# Patient Record
Sex: Male | Born: 1965 | Race: White | Hispanic: No | Marital: Married | Smoking: Current every day smoker
Health system: Southern US, Community
[De-identification: ages and names within clinical notes are randomized; demographics above are authoritative.]

## PROBLEM LIST (undated history)

## (undated) DIAGNOSIS — R918 Other nonspecific abnormal finding of lung field: Secondary | ICD-10-CM

## (undated) DIAGNOSIS — M549 Dorsalgia, unspecified: Secondary | ICD-10-CM

## (undated) DIAGNOSIS — K219 Gastro-esophageal reflux disease without esophagitis: Secondary | ICD-10-CM

## (undated) DIAGNOSIS — R079 Chest pain, unspecified: Secondary | ICD-10-CM

## (undated) DIAGNOSIS — G8929 Other chronic pain: Secondary | ICD-10-CM

## (undated) DIAGNOSIS — I10 Essential (primary) hypertension: Secondary | ICD-10-CM

## (undated) DIAGNOSIS — J449 Chronic obstructive pulmonary disease, unspecified: Secondary | ICD-10-CM

## (undated) HISTORY — PX: OTHER SURGICAL HISTORY: SHX169

---

## 2018-09-26 DIAGNOSIS — R001 Bradycardia, unspecified: Secondary | ICD-10-CM

## 2018-09-26 DIAGNOSIS — G894 Chronic pain syndrome: Secondary | ICD-10-CM | POA: Diagnosis not present

## 2018-09-26 DIAGNOSIS — I249 Acute ischemic heart disease, unspecified: Secondary | ICD-10-CM | POA: Diagnosis not present

## 2018-09-26 DIAGNOSIS — R079 Chest pain, unspecified: Secondary | ICD-10-CM | POA: Diagnosis not present

## 2018-09-26 DIAGNOSIS — I10 Essential (primary) hypertension: Secondary | ICD-10-CM

## 2018-09-26 DIAGNOSIS — Z720 Tobacco use: Secondary | ICD-10-CM

## 2018-09-27 DIAGNOSIS — R079 Chest pain, unspecified: Secondary | ICD-10-CM

## 2018-09-27 DIAGNOSIS — I340 Nonrheumatic mitral (valve) insufficiency: Secondary | ICD-10-CM | POA: Diagnosis not present

## 2018-09-27 DIAGNOSIS — I10 Essential (primary) hypertension: Secondary | ICD-10-CM | POA: Diagnosis not present

## 2018-09-27 DIAGNOSIS — I249 Acute ischemic heart disease, unspecified: Secondary | ICD-10-CM | POA: Diagnosis not present

## 2018-09-27 DIAGNOSIS — G894 Chronic pain syndrome: Secondary | ICD-10-CM | POA: Diagnosis not present

## 2018-09-27 DIAGNOSIS — R001 Bradycardia, unspecified: Secondary | ICD-10-CM | POA: Diagnosis not present

## 2019-12-10 ENCOUNTER — Emergency Department (HOSPITAL_COMMUNITY): Payer: Medicaid Other

## 2019-12-10 ENCOUNTER — Inpatient Hospital Stay (HOSPITAL_COMMUNITY)
Admission: EM | Admit: 2019-12-10 | Discharge: 2019-12-25 | DRG: 963 | Disposition: A | Discharge status: Home / Self Care | Payer: Medicaid Other | Attending: Surgery | Admitting: Surgery

## 2019-12-10 ENCOUNTER — Other Ambulatory Visit: Payer: Self-pay

## 2019-12-10 ENCOUNTER — Encounter (HOSPITAL_COMMUNITY): Payer: Self-pay

## 2019-12-10 DIAGNOSIS — T1490XA Injury, unspecified, initial encounter: Secondary | ICD-10-CM

## 2019-12-10 DIAGNOSIS — J9601 Acute respiratory failure with hypoxia: Secondary | ICD-10-CM | POA: Diagnosis present

## 2019-12-10 DIAGNOSIS — D72829 Elevated white blood cell count, unspecified: Secondary | ICD-10-CM

## 2019-12-10 DIAGNOSIS — S22089A Unspecified fracture of T11-T12 vertebra, initial encounter for closed fracture: Secondary | ICD-10-CM | POA: Diagnosis present

## 2019-12-10 DIAGNOSIS — I959 Hypotension, unspecified: Secondary | ICD-10-CM | POA: Diagnosis present

## 2019-12-10 DIAGNOSIS — Y92410 Unspecified street and highway as the place of occurrence of the external cause: Secondary | ICD-10-CM | POA: Diagnosis not present

## 2019-12-10 DIAGNOSIS — Z20822 Contact with and (suspected) exposure to covid-19: Secondary | ICD-10-CM | POA: Diagnosis present

## 2019-12-10 DIAGNOSIS — S066X9A Traumatic subarachnoid hemorrhage with loss of consciousness of unspecified duration, initial encounter: Secondary | ICD-10-CM | POA: Diagnosis present

## 2019-12-10 DIAGNOSIS — S2242XA Multiple fractures of ribs, left side, initial encounter for closed fracture: Secondary | ICD-10-CM

## 2019-12-10 DIAGNOSIS — R52 Pain, unspecified: Secondary | ICD-10-CM

## 2019-12-10 DIAGNOSIS — S42112A Displaced fracture of body of scapula, left shoulder, initial encounter for closed fracture: Secondary | ICD-10-CM | POA: Diagnosis present

## 2019-12-10 DIAGNOSIS — R402252 Coma scale, best verbal response, oriented, at arrival to emergency department: Secondary | ICD-10-CM | POA: Diagnosis present

## 2019-12-10 DIAGNOSIS — R140 Abdominal distension (gaseous): Secondary | ICD-10-CM

## 2019-12-10 DIAGNOSIS — I6200 Nontraumatic subdural hemorrhage, unspecified: Secondary | ICD-10-CM | POA: Diagnosis present

## 2019-12-10 DIAGNOSIS — M549 Dorsalgia, unspecified: Secondary | ICD-10-CM | POA: Diagnosis present

## 2019-12-10 DIAGNOSIS — G8929 Other chronic pain: Secondary | ICD-10-CM | POA: Diagnosis present

## 2019-12-10 DIAGNOSIS — K567 Ileus, unspecified: Secondary | ICD-10-CM | POA: Diagnosis not present

## 2019-12-10 DIAGNOSIS — J939 Pneumothorax, unspecified: Secondary | ICD-10-CM | POA: Diagnosis not present

## 2019-12-10 DIAGNOSIS — T07XXXA Unspecified multiple injuries, initial encounter: Secondary | ICD-10-CM | POA: Diagnosis not present

## 2019-12-10 DIAGNOSIS — R0989 Other specified symptoms and signs involving the circulatory and respiratory systems: Secondary | ICD-10-CM

## 2019-12-10 DIAGNOSIS — Z515 Encounter for palliative care: Secondary | ICD-10-CM | POA: Diagnosis not present

## 2019-12-10 DIAGNOSIS — S22079A Unspecified fracture of T9-T10 vertebra, initial encounter for closed fracture: Secondary | ICD-10-CM | POA: Diagnosis present

## 2019-12-10 DIAGNOSIS — S80211A Abrasion, right knee, initial encounter: Secondary | ICD-10-CM | POA: Diagnosis present

## 2019-12-10 DIAGNOSIS — S22039A Unspecified fracture of third thoracic vertebra, initial encounter for closed fracture: Secondary | ICD-10-CM | POA: Diagnosis present

## 2019-12-10 DIAGNOSIS — S27321A Contusion of lung, unilateral, initial encounter: Secondary | ICD-10-CM | POA: Diagnosis present

## 2019-12-10 DIAGNOSIS — Z23 Encounter for immunization: Secondary | ICD-10-CM

## 2019-12-10 DIAGNOSIS — S065X9A Traumatic subdural hemorrhage with loss of consciousness of unspecified duration, initial encounter: Secondary | ICD-10-CM | POA: Diagnosis present

## 2019-12-10 DIAGNOSIS — Z4659 Encounter for fitting and adjustment of other gastrointestinal appliance and device: Secondary | ICD-10-CM

## 2019-12-10 DIAGNOSIS — K3189 Other diseases of stomach and duodenum: Secondary | ICD-10-CM | POA: Diagnosis present

## 2019-12-10 DIAGNOSIS — R402132 Coma scale, eyes open, to sound, at arrival to emergency department: Secondary | ICD-10-CM | POA: Diagnosis present

## 2019-12-10 DIAGNOSIS — Z7189 Other specified counseling: Secondary | ICD-10-CM | POA: Diagnosis not present

## 2019-12-10 DIAGNOSIS — E876 Hypokalemia: Secondary | ICD-10-CM | POA: Diagnosis not present

## 2019-12-10 DIAGNOSIS — I609 Nontraumatic subarachnoid hemorrhage, unspecified: Secondary | ICD-10-CM | POA: Diagnosis not present

## 2019-12-10 DIAGNOSIS — S270XXA Traumatic pneumothorax, initial encounter: Secondary | ICD-10-CM | POA: Diagnosis present

## 2019-12-10 DIAGNOSIS — J439 Emphysema, unspecified: Secondary | ICD-10-CM | POA: Diagnosis present

## 2019-12-10 DIAGNOSIS — S80212A Abrasion, left knee, initial encounter: Secondary | ICD-10-CM | POA: Diagnosis present

## 2019-12-10 DIAGNOSIS — S40812A Abrasion of left upper arm, initial encounter: Secondary | ICD-10-CM | POA: Diagnosis present

## 2019-12-10 DIAGNOSIS — S2243XA Multiple fractures of ribs, bilateral, initial encounter for closed fracture: Secondary | ICD-10-CM | POA: Diagnosis present

## 2019-12-10 DIAGNOSIS — F17210 Nicotine dependence, cigarettes, uncomplicated: Secondary | ICD-10-CM | POA: Diagnosis present

## 2019-12-10 DIAGNOSIS — Z79891 Long term (current) use of opiate analgesic: Secondary | ICD-10-CM

## 2019-12-10 DIAGNOSIS — R402362 Coma scale, best motor response, obeys commands, at arrival to emergency department: Secondary | ICD-10-CM | POA: Diagnosis present

## 2019-12-10 DIAGNOSIS — S0101XA Laceration without foreign body of scalp, initial encounter: Secondary | ICD-10-CM | POA: Diagnosis present

## 2019-12-10 DIAGNOSIS — S0081XA Abrasion of other part of head, initial encounter: Secondary | ICD-10-CM | POA: Diagnosis present

## 2019-12-10 DIAGNOSIS — S60511A Abrasion of right hand, initial encounter: Secondary | ICD-10-CM | POA: Diagnosis present

## 2019-12-10 DIAGNOSIS — I10 Essential (primary) hypertension: Secondary | ICD-10-CM | POA: Diagnosis present

## 2019-12-10 DIAGNOSIS — S32049A Unspecified fracture of fourth lumbar vertebra, initial encounter for closed fracture: Secondary | ICD-10-CM | POA: Diagnosis present

## 2019-12-10 DIAGNOSIS — S42002A Fracture of unspecified part of left clavicle, initial encounter for closed fracture: Secondary | ICD-10-CM | POA: Diagnosis present

## 2019-12-10 DIAGNOSIS — Z8249 Family history of ischemic heart disease and other diseases of the circulatory system: Secondary | ICD-10-CM

## 2019-12-10 DIAGNOSIS — S2249XA Multiple fractures of ribs, unspecified side, initial encounter for closed fracture: Secondary | ICD-10-CM

## 2019-12-10 DIAGNOSIS — D649 Anemia, unspecified: Secondary | ICD-10-CM | POA: Diagnosis present

## 2019-12-10 DIAGNOSIS — J9690 Respiratory failure, unspecified, unspecified whether with hypoxia or hypercapnia: Secondary | ICD-10-CM

## 2019-12-10 HISTORY — DX: Other nonspecific abnormal finding of lung field: R91.8

## 2019-12-10 HISTORY — DX: Other chronic pain: G89.29

## 2019-12-10 HISTORY — DX: Dorsalgia, unspecified: M54.9

## 2019-12-10 HISTORY — DX: Chronic obstructive pulmonary disease, unspecified: J44.9

## 2019-12-10 HISTORY — DX: Essential (primary) hypertension: I10

## 2019-12-10 HISTORY — DX: Gastro-esophageal reflux disease without esophagitis: K21.9

## 2019-12-10 HISTORY — DX: Gastro-esophageal reflux disease without esophagitis: R07.9

## 2019-12-10 LAB — MRSA PCR SCREENING: MRSA by PCR: NEGATIVE

## 2019-12-10 LAB — URINALYSIS, ROUTINE W REFLEX MICROSCOPIC
Bacteria, UA: NONE SEEN
Bilirubin Urine: NEGATIVE
Glucose, UA: NEGATIVE mg/dL
Ketones, ur: NEGATIVE mg/dL
Leukocytes,Ua: NEGATIVE
Nitrite: NEGATIVE
Protein, ur: 30 mg/dL — AB
Specific Gravity, Urine: 1.027 (ref 1.005–1.030)
pH: 7 (ref 5.0–8.0)

## 2019-12-10 LAB — CBC
HCT: 38.3 % — ABNORMAL LOW (ref 39.0–52.0)
Hemoglobin: 12.7 g/dL — ABNORMAL LOW (ref 13.0–17.0)
MCH: 31.1 pg (ref 26.0–34.0)
MCHC: 33.2 g/dL (ref 30.0–36.0)
MCV: 93.9 fL (ref 80.0–100.0)
Platelets: 304 10*3/uL (ref 150–400)
RBC: 4.08 MIL/uL — ABNORMAL LOW (ref 4.22–5.81)
RDW: 12.7 % (ref 11.5–15.5)
WBC: 35 10*3/uL — ABNORMAL HIGH (ref 4.0–10.5)
nRBC: 0 % (ref 0.0–0.2)

## 2019-12-10 LAB — PROTIME-INR
INR: 1.1 (ref 0.8–1.2)
Prothrombin Time: 13.7 seconds (ref 11.4–15.2)

## 2019-12-10 LAB — I-STAT CHEM 8, ED
BUN: 6 mg/dL (ref 6–20)
Calcium, Ion: 1.08 mmol/L — ABNORMAL LOW (ref 1.15–1.40)
Chloride: 96 mmol/L — ABNORMAL LOW (ref 98–111)
Creatinine, Ser: 1.1 mg/dL (ref 0.61–1.24)
Glucose, Bld: 173 mg/dL — ABNORMAL HIGH (ref 70–99)
HCT: 36 % — ABNORMAL LOW (ref 39.0–52.0)
Hemoglobin: 12.2 g/dL — ABNORMAL LOW (ref 13.0–17.0)
Potassium: 2.6 mmol/L — CL (ref 3.5–5.1)
Sodium: 135 mmol/L (ref 135–145)
TCO2: 24 mmol/L (ref 22–32)

## 2019-12-10 LAB — COMPREHENSIVE METABOLIC PANEL
ALT: 82 U/L — ABNORMAL HIGH (ref 0–44)
AST: 103 U/L — ABNORMAL HIGH (ref 15–41)
Albumin: 3.2 g/dL — ABNORMAL LOW (ref 3.5–5.0)
Alkaline Phosphatase: 159 U/L — ABNORMAL HIGH (ref 38–126)
Anion gap: 13 (ref 5–15)
BUN: 5 mg/dL — ABNORMAL LOW (ref 6–20)
CO2: 23 mmol/L (ref 22–32)
Calcium: 8.5 mg/dL — ABNORMAL LOW (ref 8.9–10.3)
Chloride: 99 mmol/L (ref 98–111)
Creatinine, Ser: 1.14 mg/dL (ref 0.61–1.24)
GFR calc Af Amer: >60 mL/min (ref >60)
GFR calc non Af Amer: >60 mL/min (ref >60)
Glucose, Bld: 182 mg/dL — ABNORMAL HIGH (ref 70–99)
Potassium: 2.5 mmol/L — CL (ref 3.5–5.1)
Sodium: 135 mmol/L (ref 135–145)
Total Bilirubin: 0.9 mg/dL (ref 0.3–1.2)
Total Protein: 6.5 g/dL (ref 6.5–8.1)

## 2019-12-10 LAB — ABO/RH: ABO/RH(D): O POS

## 2019-12-10 LAB — SARS CORONAVIRUS 2 BY RT PCR (HOSPITAL ORDER, PERFORMED IN ~~LOC~~ HOSPITAL LAB): SARS Coronavirus 2: NEGATIVE

## 2019-12-10 LAB — PREPARE RBC (CROSSMATCH)

## 2019-12-10 LAB — LACTIC ACID, PLASMA: Lactic Acid, Venous: 4.2 mmol/L (ref 0.5–1.9)

## 2019-12-10 LAB — ETHANOL: Alcohol, Ethyl (B): <10 mg/dL (ref <10)

## 2019-12-10 MED ORDER — ONDANSETRON HCL 4 MG/2ML IJ SOLN
4.0000 mg | Freq: Four times a day (QID) | INTRAMUSCULAR | Status: DC | PRN
  Administered 2019-12-10 – 2019-12-21 (×4): 4 mg via INTRAVENOUS
  Filled 2019-12-10 (×4): qty 2

## 2019-12-10 MED ORDER — BACITRACIN ZINC 500 UNIT/GM EX OINT
1.0000 "application " | TOPICAL_OINTMENT | Freq: Two times a day (BID) | CUTANEOUS | Status: DC
  Administered 2019-12-10 – 2019-12-25 (×27): 1 via TOPICAL
  Filled 2019-12-10 (×3): qty 28.4
  Filled 2019-12-10 (×3): qty 28.35

## 2019-12-10 MED ORDER — HYDROMORPHONE HCL 1 MG/ML IJ SOLN
1.0000 mg | INTRAMUSCULAR | Status: DC | PRN
  Administered 2019-12-10 – 2019-12-11 (×4): 1 mg via INTRAVENOUS
  Filled 2019-12-10 (×4): qty 1

## 2019-12-10 MED ORDER — TETANUS-DIPHTH-ACELL PERTUSSIS 5-2.5-18.5 LF-MCG/0.5 IM SUSP
0.5000 mL | Freq: Once | INTRAMUSCULAR | Status: AC
  Administered 2019-12-10: 0.5 mL via INTRAMUSCULAR

## 2019-12-10 MED ORDER — ALBUMIN HUMAN 5 % IV SOLN
25.0000 g | Freq: Once | INTRAVENOUS | Status: AC
  Administered 2019-12-10: 25 g via INTRAVENOUS
  Filled 2019-12-10: qty 500

## 2019-12-10 MED ORDER — IOHEXOL 300 MG/ML  SOLN
100.0000 mL | Freq: Once | INTRAMUSCULAR | Status: AC | PRN
  Administered 2019-12-10: 100 mL via INTRAVENOUS

## 2019-12-10 MED ORDER — POTASSIUM CHLORIDE 10 MEQ/100ML IV SOLN
10.0000 meq | Freq: Once | INTRAVENOUS | Status: AC
  Administered 2019-12-10: 10 meq via INTRAVENOUS

## 2019-12-10 MED ORDER — PANTOPRAZOLE SODIUM 40 MG IV SOLR
40.0000 mg | Freq: Every day | INTRAVENOUS | Status: DC
  Administered 2019-12-10: 40 mg via INTRAVENOUS
  Filled 2019-12-10: qty 40

## 2019-12-10 MED ORDER — CEFAZOLIN SODIUM-DEXTROSE 2-4 GM/100ML-% IV SOLN
2.0000 g | Freq: Once | INTRAVENOUS | Status: AC
  Administered 2019-12-10: 2 g via INTRAVENOUS

## 2019-12-10 MED ORDER — POTASSIUM CHLORIDE IN NACL 20-0.9 MEQ/L-% IV SOLN
INTRAVENOUS | Status: DC
  Filled 2019-12-10 (×3): qty 1000

## 2019-12-10 MED ORDER — ACETAMINOPHEN 325 MG PO TABS
650.0000 mg | ORAL_TABLET | Freq: Four times a day (QID) | ORAL | Status: DC
  Administered 2019-12-10: 650 mg via ORAL
  Filled 2019-12-10 (×2): qty 2

## 2019-12-10 MED ORDER — ORAL CARE MOUTH RINSE
15.0000 mL | Freq: Two times a day (BID) | OROMUCOSAL | Status: DC
  Administered 2019-12-11 – 2019-12-15 (×9): 15 mL via OROMUCOSAL

## 2019-12-10 MED ORDER — ONDANSETRON 4 MG PO TBDP
4.0000 mg | ORAL_TABLET | Freq: Four times a day (QID) | ORAL | Status: DC | PRN
  Filled 2019-12-10: qty 1

## 2019-12-10 MED ORDER — FENTANYL CITRATE (PF) 100 MCG/2ML IJ SOLN
50.0000 ug | Freq: Once | INTRAMUSCULAR | Status: AC
  Administered 2019-12-10: 100 ug via INTRAVENOUS
  Filled 2019-12-10: qty 2

## 2019-12-10 MED ORDER — SODIUM CHLORIDE 0.9 % IV SOLN: 10.0000 mL/h | Freq: Once | INTRAVENOUS | Status: DC

## 2019-12-10 MED ORDER — METHOCARBAMOL 1000 MG/10ML IJ SOLN
1000.0000 mg | Freq: Three times a day (TID) | INTRAVENOUS | Status: DC | PRN
  Administered 2019-12-10: 1000 mg via INTRAVENOUS
  Filled 2019-12-10 (×2): qty 10

## 2019-12-10 MED ORDER — HYDROMORPHONE HCL 1 MG/ML IJ SOLN: 0.5000 mg | INTRAMUSCULAR | Status: DC | PRN

## 2019-12-10 MED ORDER — FENTANYL CITRATE (PF) 100 MCG/2ML IJ SOLN
50.0000 ug | INTRAMUSCULAR | Status: DC | PRN
  Administered 2019-12-10 – 2019-12-11 (×7): 100 ug via INTRAVENOUS
  Filled 2019-12-10 (×7): qty 2

## 2019-12-10 MED ORDER — LACTATED RINGERS IV BOLUS
1000.0000 mL | Freq: Once | INTRAVENOUS | Status: AC
  Administered 2019-12-10: 1000 mL via INTRAVENOUS

## 2019-12-10 MED ORDER — PANTOPRAZOLE SODIUM 40 MG PO TBEC: 40.0000 mg | DELAYED_RELEASE_TABLET | Freq: Every day | ORAL | Status: DC

## 2019-12-10 MED ORDER — POTASSIUM CHLORIDE 10 MEQ/100ML IV SOLN
10.0000 meq | INTRAVENOUS | Status: AC
  Administered 2019-12-10 (×2): 10 meq via INTRAVENOUS
  Filled 2019-12-10 (×3): qty 100
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
## 2019-12-10 NOTE — Procedures (Signed)
Chest Tube Insertion Procedure Note  Indications:  Clinically significant L pneumothorax  Pre-operative Diagnosis: L pneumothorax  Post-operative Diagnosis:  L pneumothorax  Procedure Details  Emergency consent due to TBI and acuity  After sterile skin prep, using standard technique, a 14 French tube was placed in the left anterior axillary line above the nipple level using Seldinger technique. It was sutured in and hooked up to a Pleurevac. Sterile dressing applied.  Findings: Air  Estimated Blood Loss: minimal         Specimens: none              Complications:  None; patient tolerated the procedure well.         Disposition: admit to ICU         Condition: guarded  Violeta Gelinas, MD, MPH, FACS Please use [email or web address] to contact on call provider
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
## 2019-12-10 NOTE — ED Notes (Signed)
Dr Janee Morn at bedside to insert left sided chest tube
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
## 2019-12-10 NOTE — ED Notes (Signed)
Pt transported to CT with this RN, [place], RN and [place], [place]

## 2019-12-10 NOTE — ED Notes (Signed)
Unit of emergency release blood completed

## 2019-12-10 NOTE — ED Notes (Signed)
X-ray at bedside

## 2019-12-10 NOTE — Progress Notes (Signed)
Chaplain checked with ED bridge. Chaplain not needed at this time.  However, chaplain received that patient was upgraded to a Level 1.  Chaplain available by page. Rev. Lynnell Chad Pager [phone]

## 2019-12-10 NOTE — ED Notes (Signed)
Potassium 2.6 on I-stat chem-8.  Dr. Silverio Lay notified

## 2019-12-10 NOTE — ED Notes (Signed)
Report called to 2H 

## 2019-12-11 ENCOUNTER — Encounter (HOSPITAL_COMMUNITY): Payer: Self-pay | Admitting: General Surgery

## 2019-12-11 ENCOUNTER — Inpatient Hospital Stay (HOSPITAL_COMMUNITY): Payer: Medicaid Other

## 2019-12-11 ENCOUNTER — Other Ambulatory Visit: Payer: Self-pay

## 2019-12-11 LAB — BASIC METABOLIC PANEL
Anion gap: 14 (ref 5–15)
BUN: 11 mg/dL (ref 6–20)
CO2: 23 mmol/L (ref 22–32)
Calcium: 8.6 mg/dL — ABNORMAL LOW (ref 8.9–10.3)
Chloride: 99 mmol/L (ref 98–111)
Creatinine, Ser: 1.11 mg/dL (ref 0.61–1.24)
GFR calc Af Amer: >60 mL/min (ref >60)
GFR calc non Af Amer: >60 mL/min (ref >60)
Glucose, Bld: 149 mg/dL — ABNORMAL HIGH (ref 70–99)
Potassium: 4.1 mmol/L (ref 3.5–5.1)
Sodium: 136 mmol/L (ref 135–145)

## 2019-12-11 LAB — TYPE AND SCREEN
ABO/RH(D): O POS
ABO/RH(D): O POS
Antibody Screen: NEGATIVE
Unit division: 0
Unit division: 0
Unit division: 0
Unit division: 0

## 2019-12-11 LAB — BPAM RBC
Blood Product Expiration Date: 202107092359
Blood Product Expiration Date: 202107092359
Blood Product Expiration Date: 202107092359
Blood Product Expiration Date: 202107102359
ISSUE DATE / TIME: 202106061307
ISSUE DATE / TIME: 202106061703
ISSUE DATE / TIME: 202106061703
Unit Type and Rh: 5100
Unit Type and Rh: 5100
Unit Type and Rh: 5100
Unit Type and Rh: 5100

## 2019-12-11 LAB — CBC
HCT: 39.1 % (ref 39.0–52.0)
Hemoglobin: 13.1 g/dL (ref 13.0–17.0)
MCH: 31 pg (ref 26.0–34.0)
MCHC: 33.5 g/dL (ref 30.0–36.0)
MCV: 92.4 fL (ref 80.0–100.0)
Platelets: 194 10*3/uL (ref 150–400)
RBC: 4.23 MIL/uL (ref 4.22–5.81)
RDW: 13 % (ref 11.5–15.5)
WBC: 15.5 10*3/uL — ABNORMAL HIGH (ref 4.0–10.5)
nRBC: 0 % (ref 0.0–0.2)

## 2019-12-11 LAB — HIV ANTIBODY (ROUTINE TESTING W REFLEX): HIV Screen 4th Generation wRfx: NONREACTIVE

## 2019-12-11 MED ORDER — PNEUMOCOCCAL VAC POLYVALENT 25 MCG/0.5ML IJ INJ: 0.5000 mL | INJECTION | INTRAMUSCULAR | Status: DC | PRN

## 2019-12-11 MED ORDER — METHOCARBAMOL 1000 MG/10ML IJ SOLN
1000.0000 mg | Freq: Three times a day (TID) | INTRAVENOUS | Status: DC
  Administered 2019-12-11 – 2019-12-16 (×16): 1000 mg via INTRAVENOUS
  Filled 2019-12-11 (×19): qty 10

## 2019-12-11 MED ORDER — PANTOPRAZOLE SODIUM 40 MG PO TBEC
40.0000 mg | DELAYED_RELEASE_TABLET | Freq: Every day | ORAL | Status: DC
  Administered 2019-12-11: 40 mg via ORAL
  Filled 2019-12-11 (×2): qty 1

## 2019-12-11 MED ORDER — ROSUVASTATIN CALCIUM 20 MG PO TABS
40.0000 mg | ORAL_TABLET | Freq: Every day | ORAL | Status: DC
  Administered 2019-12-11: 40 mg via ORAL
  Filled 2019-12-11 (×2): qty 2

## 2019-12-11 MED ORDER — OXYCODONE HCL 5 MG/5ML PO SOLN
10.0000 mg | ORAL | Status: DC | PRN
  Administered 2019-12-11: 15 mg via ORAL
  Administered 2019-12-12 (×2): 10 mg via ORAL
  Administered 2019-12-12 – 2019-12-13 (×3): 15 mg via ORAL
  Administered 2019-12-13: 10 mg via ORAL
  Administered 2019-12-14 – 2019-12-16 (×10): 15 mg via ORAL
  Filled 2019-12-11 (×4): qty 15
  Filled 2019-12-11 (×2): qty 10
  Filled 2019-12-11 (×2): qty 15
  Filled 2019-12-11: qty 10
  Filled 2019-12-11 (×8): qty 15

## 2019-12-11 MED ORDER — NALOXEGOL OXALATE 25 MG PO TABS
25.0000 mg | ORAL_TABLET | Freq: Every day | ORAL | Status: DC
  Administered 2019-12-11: 25 mg via ORAL
  Filled 2019-12-11 (×2): qty 1

## 2019-12-11 MED ORDER — SODIUM CHLORIDE 0.9 % IV SOLN: INTRAVENOUS | Status: DC

## 2019-12-11 MED ORDER — HYDRALAZINE HCL 20 MG/ML IJ SOLN
10.0000 mg | Freq: Four times a day (QID) | INTRAMUSCULAR | Status: DC | PRN
  Administered 2019-12-11: 10 mg via INTRAVENOUS
  Filled 2019-12-11: qty 1

## 2019-12-11 MED ORDER — ALBUTEROL SULFATE (2.5 MG/3ML) 0.083% IN NEBU
2.5000 mg | INHALATION_SOLUTION | RESPIRATORY_TRACT | Status: DC | PRN
  Administered 2019-12-11 – 2019-12-21 (×2): 2.5 mg via RESPIRATORY_TRACT
  Filled 2019-12-11 (×2): qty 3

## 2019-12-11 MED ORDER — HYDROCHLOROTHIAZIDE 12.5 MG PO CAPS
12.5000 mg | ORAL_CAPSULE | Freq: Every day | ORAL | Status: DC
  Administered 2019-12-12 – 2019-12-13 (×2): 12.5 mg via ORAL
  Filled 2019-12-11 (×3): qty 1

## 2019-12-11 MED ORDER — MOMETASONE FURO-FORMOTEROL FUM 200-5 MCG/ACT IN AERO
2.0000 | INHALATION_SPRAY | Freq: Two times a day (BID) | RESPIRATORY_TRACT | Status: DC
  Administered 2019-12-11 – 2019-12-25 (×22): 2 via RESPIRATORY_TRACT
  Filled 2019-12-11 (×2): qty 8.8

## 2019-12-11 MED ORDER — DULOXETINE HCL 60 MG PO CPEP
60.0000 mg | ORAL_CAPSULE | Freq: Every day | ORAL | Status: DC
  Administered 2019-12-11: 60 mg via ORAL
  Filled 2019-12-11 (×2): qty 1

## 2019-12-11 MED ORDER — SUCRALFATE 1 G PO TABS
1.0000 g | ORAL_TABLET | Freq: Four times a day (QID) | ORAL | Status: DC
  Administered 2019-12-11 (×3): 1 g via ORAL
  Filled 2019-12-11 (×6): qty 1

## 2019-12-11 MED ORDER — LEVETIRACETAM IN NACL 500 MG/100ML IV SOLN
500.0000 mg | Freq: Two times a day (BID) | INTRAVENOUS | Status: AC
  Administered 2019-12-11 – 2019-12-17 (×14): 500 mg via INTRAVENOUS
  Filled 2019-12-11 (×15): qty 100

## 2019-12-11 MED ORDER — LISINOPRIL-HYDROCHLOROTHIAZIDE 20-12.5 MG PO TABS: 1.0000 | ORAL_TABLET | Freq: Every day | ORAL | Status: DC

## 2019-12-11 MED ORDER — ACETAMINOPHEN 500 MG PO TABS
1000.0000 mg | ORAL_TABLET | Freq: Four times a day (QID) | ORAL | Status: DC
  Administered 2019-12-11 – 2019-12-12 (×3): 1000 mg via ORAL
  Filled 2019-12-11 (×4): qty 2

## 2019-12-11 MED ORDER — HYDROMORPHONE HCL 1 MG/ML IJ SOLN
1.0000 mg | INTRAMUSCULAR | Status: DC | PRN
  Administered 2019-12-11 – 2019-12-17 (×19): 1 mg via INTRAVENOUS
  Filled 2019-12-11 (×19): qty 1

## 2019-12-11 MED ORDER — LISINOPRIL 20 MG PO TABS
20.0000 mg | ORAL_TABLET | Freq: Every day | ORAL | Status: DC
  Administered 2019-12-11 – 2019-12-12 (×2): 20 mg via ORAL
  Filled 2019-12-11 (×3): qty 1

## 2019-12-11 MED ORDER — AMLODIPINE BESYLATE 10 MG PO TABS: 10.0000 mg | ORAL_TABLET | Freq: Every day | ORAL | Status: DC

## 2019-12-11 MED ORDER — DOCUSATE SODIUM 100 MG PO CAPS
100.0000 mg | ORAL_CAPSULE | Freq: Two times a day (BID) | ORAL | Status: DC
  Administered 2019-12-11 (×2): 100 mg via ORAL
  Filled 2019-12-11 (×3): qty 1

## 2019-12-11 MED ORDER — OXYCODONE HCL ER 10 MG PO T12A
30.0000 mg | EXTENDED_RELEASE_TABLET | Freq: Two times a day (BID) | ORAL | Status: DC
  Administered 2019-12-11 (×2): 30 mg via ORAL
  Filled 2019-12-11 (×3): qty 3

## 2019-12-11 MED ORDER — ENOXAPARIN SODIUM 40 MG/0.4ML ~~LOC~~ SOLN
40.0000 mg | Freq: Two times a day (BID) | SUBCUTANEOUS | Status: DC
  Administered 2019-12-12 – 2019-12-25 (×27): 40 mg via SUBCUTANEOUS
  Filled 2019-12-11 (×27): qty 0.4

## 2019-12-11 MED ORDER — CHLORHEXIDINE GLUCONATE CLOTH 2 % EX PADS
6.0000 | MEDICATED_PAD | Freq: Every day | CUTANEOUS | Status: DC
  Administered 2019-12-11 – 2019-12-21 (×10): 6 via TOPICAL

## 2019-12-11 MED ORDER — GABAPENTIN 600 MG PO TABS
600.0000 mg | ORAL_TABLET | Freq: Three times a day (TID) | ORAL | Status: DC
  Administered 2019-12-11 (×3): 600 mg via ORAL
  Filled 2019-12-11 (×4): qty 1

## 2019-12-11 MED ORDER — METOPROLOL TARTRATE 5 MG/5ML IV SOLN: 5.0000 mg | Freq: Four times a day (QID) | INTRAVENOUS | Status: DC | PRN
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
## 2019-12-11 NOTE — Progress Notes (Signed)
Rehab Admissions Coordinator Note:  Patient was screened by Clois Dupes for appropriateness for an Inpatient Acute Rehab Consult per therapy recs but patient has not participated yet. I will follow progress before seeking consult.    Clois Dupes RN MSN 12/11/2019, 5:58 PM  I can be reached at [phone].
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
## 2019-12-11 NOTE — Progress Notes (Addendum)
Pt had an episode of emesis at 0400. Zofran was given. Pt refused to get his sheets changed from this episode due to the amount of pain he was in. Pt was educated and pain medication was given.  The pt stated that he needed to cough when offering his scheduled medication at midnight. Due to that reason, he refused the medication. The pt was educated on coughing and deep breathing, but refused to attempt.  The pt has refused to be turned throughout the night due to his pain level, education was provided.   The pt voided in the bed at 0600. He refused to get his sheets changed due to pain. Education was provided.

## 2019-12-11 NOTE — Progress Notes (Signed)
Pt having episodes of desating due to not being able to take a deep breath.  Patient was on 5 lpm nasal cannula.  Patient now on 7 liter high flow salter cannula.  Sp02 is improving.
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
## 2019-12-12 ENCOUNTER — Inpatient Hospital Stay (HOSPITAL_COMMUNITY): Payer: Medicaid Other

## 2019-12-12 LAB — POCT I-STAT 7, (LYTES, BLD GAS, ICA,H+H)
Acid-Base Excess: 1 mmol/L (ref 0.0–2.0)
Bicarbonate: 26.3 mmol/L (ref 20.0–28.0)
Calcium, Ion: 1.15 mmol/L (ref 1.15–1.40)
HCT: 29 % — ABNORMAL LOW (ref 39.0–52.0)
Hemoglobin: 9.9 g/dL — ABNORMAL LOW (ref 13.0–17.0)
O2 Saturation: 96 %
Patient temperature: 99.2
Potassium: 3.6 mmol/L (ref 3.5–5.1)
Sodium: 136 mmol/L (ref 135–145)
TCO2: 28 mmol/L (ref 22–32)
pCO2 arterial: 43.7 mmHg (ref 32.0–48.0)
pH, Arterial: 7.388 (ref 7.350–7.450)
pO2, Arterial: 86 mmHg (ref 83.0–108.0)

## 2019-12-12 LAB — BASIC METABOLIC PANEL
Anion gap: 10 (ref 5–15)
BUN: 15 mg/dL (ref 6–20)
CO2: 24 mmol/L (ref 22–32)
Calcium: 7.9 mg/dL — ABNORMAL LOW (ref 8.9–10.3)
Chloride: 101 mmol/L (ref 98–111)
Creatinine, Ser: 0.78 mg/dL (ref 0.61–1.24)
GFR calc Af Amer: >60 mL/min (ref >60)
GFR calc non Af Amer: >60 mL/min (ref >60)
Glucose, Bld: 134 mg/dL — ABNORMAL HIGH (ref 70–99)
Potassium: 3.6 mmol/L (ref 3.5–5.1)
Sodium: 135 mmol/L (ref 135–145)

## 2019-12-12 LAB — CBC
HCT: 31.9 % — ABNORMAL LOW (ref 39.0–52.0)
Hemoglobin: 10.9 g/dL — ABNORMAL LOW (ref 13.0–17.0)
MCH: 31.3 pg (ref 26.0–34.0)
MCHC: 34.2 g/dL (ref 30.0–36.0)
MCV: 91.7 fL (ref 80.0–100.0)
Platelets: 179 10*3/uL (ref 150–400)
RBC: 3.48 MIL/uL — ABNORMAL LOW (ref 4.22–5.81)
RDW: 12.9 % (ref 11.5–15.5)
WBC: 16.6 10*3/uL — ABNORMAL HIGH (ref 4.0–10.5)
nRBC: 0 % (ref 0.0–0.2)

## 2019-12-12 LAB — MAGNESIUM: Magnesium: 1.8 mg/dL (ref 1.7–2.4)

## 2019-12-12 LAB — PHOSPHORUS: Phosphorus: 2.5 mg/dL (ref 2.5–4.6)

## 2019-12-12 MED ORDER — GABAPENTIN 250 MG/5ML PO SOLN
600.0000 mg | Freq: Three times a day (TID) | ORAL | Status: DC
  Administered 2019-12-12 – 2019-12-15 (×10): 600 mg
  Filled 2019-12-12 (×12): qty 12

## 2019-12-12 MED ORDER — SUCRALFATE 1 GM/10ML PO SUSP
1.0000 g | Freq: Three times a day (TID) | ORAL | Status: DC
  Administered 2019-12-12 – 2019-12-15 (×13): 1 g
  Filled 2019-12-12 (×14): qty 10

## 2019-12-12 MED ORDER — ROSUVASTATIN CALCIUM 20 MG PO TABS
40.0000 mg | ORAL_TABLET | Freq: Every day | ORAL | Status: DC
  Administered 2019-12-12 – 2019-12-15 (×4): 40 mg
  Filled 2019-12-12 (×5): qty 2

## 2019-12-12 MED ORDER — HYDROMORPHONE HCL 1 MG/ML IJ SOLN
1.0000 mg | Freq: Once | INTRAMUSCULAR | Status: AC
  Administered 2019-12-12: 1 mg via INTRAVENOUS
  Filled 2019-12-12: qty 1

## 2019-12-12 MED ORDER — ALBUTEROL SULFATE (2.5 MG/3ML) 0.083% IN NEBU
2.5000 mg | INHALATION_SOLUTION | RESPIRATORY_TRACT | Status: AC
  Administered 2019-12-12 – 2019-12-14 (×11): 2.5 mg via RESPIRATORY_TRACT
  Filled 2019-12-12 (×12): qty 3

## 2019-12-12 MED ORDER — ACETAMINOPHEN 160 MG/5ML PO SOLN
650.0000 mg | Freq: Four times a day (QID) | ORAL | Status: DC
  Administered 2019-12-12 – 2019-12-15 (×13): 650 mg
  Filled 2019-12-12 (×13): qty 20.3

## 2019-12-12 MED ORDER — POTASSIUM PHOSPHATES 15 MMOLE/5ML IV SOLN
30.0000 mmol | Freq: Once | INTRAVENOUS | Status: AC
  Administered 2019-12-12: 30 mmol via INTRAVENOUS
  Filled 2019-12-12: qty 10

## 2019-12-12 MED ORDER — NALOXEGOL OXALATE 25 MG PO TABS
25.0000 mg | ORAL_TABLET | Freq: Every day | ORAL | Status: DC
  Administered 2019-12-12 – 2019-12-15 (×4): 25 mg via NASOGASTRIC
  Filled 2019-12-12 (×5): qty 1

## 2019-12-12 MED ORDER — PANTOPRAZOLE SODIUM 40 MG PO PACK
40.0000 mg | PACK | Freq: Every day | ORAL | Status: DC
  Administered 2019-12-12 – 2019-12-15 (×4): 40 mg
  Filled 2019-12-12 (×4): qty 20

## 2019-12-12 MED ORDER — MAGNESIUM SULFATE 2 GM/50ML IV SOLN
2.0000 g | Freq: Once | INTRAVENOUS | Status: AC
  Administered 2019-12-12: 2 g via INTRAVENOUS
  Filled 2019-12-12: qty 50

## 2019-12-12 MED ORDER — DOCUSATE SODIUM 50 MG/5ML PO LIQD
100.0000 mg | Freq: Two times a day (BID) | ORAL | Status: DC
  Administered 2019-12-12 – 2019-12-15 (×7): 100 mg
  Filled 2019-12-12 (×7): qty 10

## 2019-12-12 NOTE — TOC CAGE-AID Note (Signed)
Transition of Care [facility]) - CAGE-AID Screening   Patient Details  Name: Eric Blake MRN: [id number] Date of Birth: [birth year]  [facility] CM/SW Contact:    Jimmy Picket, LCSWA Phone Number: 12/12/2019, 1:49 PM   Clinical Narrative:  Pt was unable to participate in assessment due to not being oriented.   CAGE-AID Screening: Substance Abuse Screening unable to be completed due to: : Patient unable to participate               Isabella Stalling Clinical Social Worker [phone]

## 2019-12-12 NOTE — Progress Notes (Signed)
SLP Cancellation Note  Patient Details Name: Eric Blake MRN: [id number] DOB: Jan 04, 1966   Cancelled treatment:       Reason Eval/Treat Not Completed: Fatigue/lethargy limiting ability to participate. Patient given pain meds this am for placement of NG tube. Per RN, was following commands and verbalizing this am. Now not verbalizing, did shake head no when asked if he was able to wake up for SLP evaluation. Patient is too lethargic for evaluation this am, contribution of meds would not allow for clear picture of cognitive abilities. Will f/u 6/9.  Makaylynn Bonillas MA, CCC-SLP     Etoy Mcdonnell Meryl 12/12/2019, 10:06 AM
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
## 2019-12-12 NOTE — Progress Notes (Signed)
Orthopedic Tech Progress Note Patient Details:  Eric Blake 11/16/1965 [id number] Spoke with THERAPY about patient needing ARM SLING. RN said after she does patient wound changing she would apply sling. Ortho Devices Type of Ortho Device: Arm sling Ortho Device/Splint Location: LUE Ortho Device/Splint Interventions: Adjustment, Other (comment)   Post Interventions Patient Tolerated: Well Instructions Provided: Care of device   [name] 12/12/2019, 3:22 PM

## 2019-12-12 NOTE — Progress Notes (Signed)
Patient ID: Eric Blake, male   DOB: 1966/02/28, 54 y.o.   MRN: [id number] I came by to re-evaluate resp status. [place] O2 has been weaned down to 10L but sats 87% with a lot of rhonchi. He would not cough so I did NTS. Some secretions cleared and sats up to 91%. Continue NTS PRN and we will watch closely. Decrease IVF to 50/h. I advised him that he needs to cough or he will end up intubated.  Patient examined and I agree with the assessment and plan  Violeta Gelinas, MD, MPH, FACS Please use [email or web address] to contact on call provider 12/12/2019 12:32 PM
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
## 2019-12-13 ENCOUNTER — Inpatient Hospital Stay (HOSPITAL_COMMUNITY): Payer: Medicaid Other

## 2019-12-13 DIAGNOSIS — Z7189 Other specified counseling: Secondary | ICD-10-CM

## 2019-12-13 DIAGNOSIS — J939 Pneumothorax, unspecified: Secondary | ICD-10-CM

## 2019-12-13 DIAGNOSIS — Z515 Encounter for palliative care: Secondary | ICD-10-CM

## 2019-12-13 DIAGNOSIS — T07XXXA Unspecified multiple injuries, initial encounter: Secondary | ICD-10-CM

## 2019-12-13 DIAGNOSIS — J9601 Acute respiratory failure with hypoxia: Secondary | ICD-10-CM

## 2019-12-13 DIAGNOSIS — I6200 Nontraumatic subdural hemorrhage, unspecified: Secondary | ICD-10-CM

## 2019-12-13 DIAGNOSIS — T1490XA Injury, unspecified, initial encounter: Secondary | ICD-10-CM

## 2019-12-13 DIAGNOSIS — S2242XA Multiple fractures of ribs, left side, initial encounter for closed fracture: Secondary | ICD-10-CM

## 2019-12-13 DIAGNOSIS — I609 Nontraumatic subarachnoid hemorrhage, unspecified: Secondary | ICD-10-CM

## 2019-12-13 LAB — BASIC METABOLIC PANEL
Anion gap: 11 (ref 5–15)
BUN: 17 mg/dL (ref 6–20)
CO2: 25 mmol/L (ref 22–32)
Calcium: 7.5 mg/dL — ABNORMAL LOW (ref 8.9–10.3)
Chloride: 101 mmol/L (ref 98–111)
Creatinine, Ser: 0.81 mg/dL (ref 0.61–1.24)
GFR calc Af Amer: >60 mL/min (ref >60)
GFR calc non Af Amer: >60 mL/min (ref >60)
Glucose, Bld: 124 mg/dL — ABNORMAL HIGH (ref 70–99)
Potassium: 3.3 mmol/L — ABNORMAL LOW (ref 3.5–5.1)
Sodium: 137 mmol/L (ref 135–145)

## 2019-12-13 LAB — CBC
HCT: 26.7 % — ABNORMAL LOW (ref 39.0–52.0)
Hemoglobin: 8.9 g/dL — ABNORMAL LOW (ref 13.0–17.0)
MCH: 30.6 pg (ref 26.0–34.0)
MCHC: 33.3 g/dL (ref 30.0–36.0)
MCV: 91.8 fL (ref 80.0–100.0)
Platelets: 134 10*3/uL — ABNORMAL LOW (ref 150–400)
RBC: 2.91 MIL/uL — ABNORMAL LOW (ref 4.22–5.81)
RDW: 13.1 % (ref 11.5–15.5)
WBC: 8.7 10*3/uL (ref 4.0–10.5)
nRBC: 0 % (ref 0.0–0.2)

## 2019-12-13 LAB — GLUCOSE, CAPILLARY
Glucose-Capillary: 108 mg/dL — ABNORMAL HIGH (ref 70–99)
Glucose-Capillary: 125 mg/dL — ABNORMAL HIGH (ref 70–99)
Glucose-Capillary: 141 mg/dL — ABNORMAL HIGH (ref 70–99)
Glucose-Capillary: 173 mg/dL — ABNORMAL HIGH (ref 70–99)

## 2019-12-13 LAB — MAGNESIUM: Magnesium: 2.4 mg/dL (ref 1.7–2.4)

## 2019-12-13 LAB — PHOSPHORUS: Phosphorus: 2.5 mg/dL (ref 2.5–4.6)

## 2019-12-13 MED ORDER — FUROSEMIDE 10 MG/ML IJ SOLN
20.0000 mg | Freq: Once | INTRAMUSCULAR | Status: AC
  Administered 2019-12-13: 20 mg via INTRAVENOUS
  Filled 2019-12-13: qty 2

## 2019-12-13 MED ORDER — LISINOPRIL 20 MG PO TABS
20.0000 mg | ORAL_TABLET | Freq: Every day | ORAL | Status: DC
  Administered 2019-12-14 – 2019-12-15 (×2): 20 mg
  Filled 2019-12-13 (×2): qty 1

## 2019-12-13 MED ORDER — VITAL HIGH PROTEIN PO LIQD: 1000.0000 mL | ORAL | Status: DC

## 2019-12-13 MED ORDER — HYDROCHLOROTHIAZIDE 10 MG/ML ORAL SUSPENSION
12.5000 mg | Freq: Every day | ORAL | Status: DC
  Administered 2019-12-14 – 2019-12-15 (×2): 13 mg
  Filled 2019-12-13 (×3): qty 1.88

## 2019-12-13 MED ORDER — POTASSIUM PHOSPHATES 15 MMOLE/5ML IV SOLN
30.0000 mmol | Freq: Once | INTRAVENOUS | Status: AC
  Administered 2019-12-13: 30 mmol via INTRAVENOUS
  Filled 2019-12-13: qty 10

## 2019-12-13 MED ORDER — POTASSIUM CHLORIDE 10 MEQ/100ML IV SOLN
INTRAVENOUS | Status: AC
  Filled 2019-12-13: qty 200

## 2019-12-13 MED ORDER — PIVOT 1.5 CAL PO LIQD
1000.0000 mL | ORAL | Status: DC
  Filled 2019-12-13: qty 1000

## 2019-12-13 MED ORDER — POTASSIUM CHLORIDE 10 MEQ/100ML IV SOLN
10.0000 meq | INTRAVENOUS | Status: AC
  Administered 2019-12-13 (×6): 10 meq via INTRAVENOUS
  Filled 2019-12-13 (×4): qty 100

## 2019-12-13 MED ORDER — PRO-STAT SUGAR FREE PO LIQD
30.0000 mL | Freq: Two times a day (BID) | ORAL | Status: DC
  Filled 2019-12-13: qty 30

## 2019-12-13 MED ORDER — HYDROCHLOROTHIAZIDE 12.5 MG PO CAPS: 12.5000 mg | ORAL_CAPSULE | Freq: Every day | ORAL | Status: DC
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
## 2019-12-13 NOTE — Progress Notes (Signed)
RN called stating pt desat during bath.  RT arrived to find pt needing NTS with audible rattle.  RT attempted to suction pt through oral suction but pt would not allow catheter to pass his tongue before pushing it away.  RT explained he would be placed on vent if he didn't allow [place] to suction or attempt to cough up secretions.  Pt says "Let me go, just let me go.'  Rt further explained this could possibly lead to more invasive means of support but pt was adamant that he did not want [place] to suction him. Pt now on 15LPM salter with sat of 91%.  RT will continue to monitor.

## 2019-12-13 NOTE — Progress Notes (Signed)
Called to see patient for continued lack of participation in care requiring multiple rounds of deep NT suctioning due to hypoxia and increased O2 requirements. At the time of my exam, patient is refusing to cough/deep breathe and is on a NRB with sats mid-90s. Wife is at bedside and conversation held with both of them regarding his current clinical status and impending intubation. Wife is very overwhelmed at the possibility of needing intubation and was unable to make a definitive decision. She requested I speak with her mother via phone, which I did, and explained the situation. Clearly stated that without participating in coughing/deep breathing, he will continue to decline, ultimately resulting in need for mechanical ventilation or death. Patient's mother-in-law deferred decision-making back to the patient's wife. Re-visited conversation with the patient at this time and he did make a couple of weak attempts at coughing and when provided the options of further efforts at cough/deep breathe vs intubation, elected to improve efforts to cough/deep breathe. Will attempt bipap, however if continuing to desaturate will need intubation if family is agreeable.  Diamantina Monks, MD General and Trauma Surgery [facility]
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
## 2019-12-13 NOTE — Progress Notes (Addendum)
NG dislodged while transferring pt back to bed. Attempted NG replacement, pt O2 sats decreased to 80's. Placed on NRB and called Respiratory to bedside.
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
## 2019-12-13 NOTE — Progress Notes (Signed)
RT Note:  Patient NTS x2. Moderate amount of thick pale yellow secretions obtained. Deep oral suctioned. Patient complaining of pain. Declined NTS again. Rhonchi/coarse crackles noted. RN gave pain medicine. Patient 90% on 15L HFNC.
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
## 2019-12-13 NOTE — Progress Notes (Signed)
RT note-Called to place Bipap, face projection placed and suctioning done prior to placing Bipap, Patient is very comfortable on Bipap, BBS rhonchi throuthout, continue to monitor.

## 2019-12-14 ENCOUNTER — Inpatient Hospital Stay (HOSPITAL_COMMUNITY): Payer: Medicaid Other

## 2019-12-14 ENCOUNTER — Inpatient Hospital Stay: Payer: Self-pay

## 2019-12-14 LAB — GLUCOSE, CAPILLARY
Glucose-Capillary: 105 mg/dL — ABNORMAL HIGH (ref 70–99)
Glucose-Capillary: 102 mg/dL — ABNORMAL HIGH (ref 70–99)
Glucose-Capillary: 119 mg/dL — ABNORMAL HIGH (ref 70–99)
Glucose-Capillary: 130 mg/dL — ABNORMAL HIGH (ref 70–99)
Glucose-Capillary: 119 mg/dL — ABNORMAL HIGH (ref 70–99)

## 2019-12-14 LAB — POCT I-STAT 7, (LYTES, BLD GAS, ICA,H+H)
Acid-Base Excess: 5 mmol/L — ABNORMAL HIGH (ref 0.0–2.0)
Bicarbonate: 29.6 mmol/L — ABNORMAL HIGH (ref 20.0–28.0)
Calcium, Ion: 1.1 mmol/L — ABNORMAL LOW (ref 1.15–1.40)
HCT: 24 % — ABNORMAL LOW (ref 39.0–52.0)
Hemoglobin: 8.2 g/dL — ABNORMAL LOW (ref 13.0–17.0)
O2 Saturation: 97 %
Potassium: 3.2 mmol/L — ABNORMAL LOW (ref 3.5–5.1)
Sodium: 138 mmol/L (ref 135–145)
TCO2: 31 mmol/L (ref 22–32)
pCO2 arterial: 42.2 mmHg (ref 32.0–48.0)
pH, Arterial: 7.454 — ABNORMAL HIGH (ref 7.350–7.450)
pO2, Arterial: 84 mmHg (ref 83.0–108.0)

## 2019-12-14 LAB — BASIC METABOLIC PANEL
Anion gap: 9 (ref 5–15)
BUN: 20 mg/dL (ref 6–20)
CO2: 25 mmol/L (ref 22–32)
Calcium: 7.5 mg/dL — ABNORMAL LOW (ref 8.9–10.3)
Chloride: 102 mmol/L (ref 98–111)
Creatinine, Ser: 0.8 mg/dL (ref 0.61–1.24)
GFR calc Af Amer: >60 mL/min (ref >60)
GFR calc non Af Amer: >60 mL/min (ref >60)
Glucose, Bld: 120 mg/dL — ABNORMAL HIGH (ref 70–99)
Potassium: 3.3 mmol/L — ABNORMAL LOW (ref 3.5–5.1)
Sodium: 136 mmol/L (ref 135–145)

## 2019-12-14 LAB — CBC
HCT: 26.2 % — ABNORMAL LOW (ref 39.0–52.0)
Hemoglobin: 8.9 g/dL — ABNORMAL LOW (ref 13.0–17.0)
MCH: 31.6 pg (ref 26.0–34.0)
MCHC: 34 g/dL (ref 30.0–36.0)
MCV: 92.9 fL (ref 80.0–100.0)
Platelets: 139 10*3/uL — ABNORMAL LOW (ref 150–400)
RBC: 2.82 MIL/uL — ABNORMAL LOW (ref 4.22–5.81)
RDW: 13 % (ref 11.5–15.5)
WBC: 9.2 10*3/uL (ref 4.0–10.5)
nRBC: 0 % (ref 0.0–0.2)

## 2019-12-14 LAB — MAGNESIUM: Magnesium: 2.2 mg/dL (ref 1.7–2.4)

## 2019-12-14 LAB — PHOSPHORUS: Phosphorus: 2.6 mg/dL (ref 2.5–4.6)

## 2019-12-14 MED ORDER — FUROSEMIDE 10 MG/ML IJ SOLN
80.0000 mg | Freq: Once | INTRAMUSCULAR | Status: AC
  Administered 2019-12-14: 80 mg via INTRAVENOUS
  Filled 2019-12-14: qty 8

## 2019-12-14 MED ORDER — POTASSIUM CHLORIDE 20 MEQ PO PACK
40.0000 meq | PACK | Freq: Two times a day (BID) | ORAL | Status: DC
  Administered 2019-12-14: 40 meq via ORAL
  Filled 2019-12-14: qty 2

## 2019-12-14 MED ORDER — TRAMADOL HCL 50 MG PO TABS
50.0000 mg | ORAL_TABLET | Freq: Four times a day (QID) | ORAL | Status: DC
  Administered 2019-12-14 – 2019-12-15 (×5): 50 mg
  Filled 2019-12-14 (×5): qty 1

## 2019-12-14 MED ORDER — TRAMADOL 5 MG/ML ORAL SUSPENSION: 50.0000 mg | Freq: Four times a day (QID) | ORAL | Status: DC

## 2019-12-14 MED ORDER — POTASSIUM CHLORIDE 20 MEQ/15ML (10%) PO SOLN
40.0000 meq | Freq: Once | ORAL | Status: AC
  Administered 2019-12-14: 40 meq
  Filled 2019-12-14: qty 30

## 2019-12-14 NOTE — Progress Notes (Signed)
Peripherally Inserted Central Catheter Placement  The IV Nurse has discussed with the patient and/or persons authorized to consent for the patient, the purpose of this procedure and the potential benefits and risks involved with this procedure.  The benefits include less needle sticks, lab draws from the catheter, and the patient may be discharged home with the catheter. Risks include, but not limited to, infection, bleeding, blood clot (thrombus formation), and puncture of an artery; nerve damage and irregular heartbeat and possibility to perform a PICC exchange if needed/ordered by physician.  Alternatives to this procedure were also discussed.  Bard Power PICC patient education guide, fact sheet on infection prevention and patient information card has been provided to patient /or left at bedside.    PICC Placement Documentation        Eric Blake 12/14/2019, 7:17 PM

## 2019-12-14 NOTE — Progress Notes (Signed)
Patient ID: Eric Blake, male   DOB: October 28, 1965, 54 y.o.   MRN: [id number] Off BiPAP and on HFNC O2. Having pain - add scheduled Ultram. CT to H2O seal. I spoke with his wife at the bedside.  Violeta Gelinas, MD, MPH, FACS Please use [email or web address] to contact on call provider

## 2019-12-14 NOTE — Progress Notes (Signed)
RT asked to NTS pt d/t secretions. Pt did not tolerate very well even with nasal trumpet in place. RT unable to pass nasal catheter through trumpet to suction pt. Nasal trumpet removed and RT still unable to pass suction catheter through nare very far. Pt stated his nose has been broken before and was never fixed. RN and RT working with pt on coughing and expectorating secretions. Flutter ordered to help pt with secretion expectoration. RT will continue to monitor.
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
## 2019-12-14 NOTE — Progress Notes (Signed)
Attempted right upper arm basilic vein with complications, unable to thread PICC pass clavicle despite multiple techniques attempts to pass PICC. Various positions attempted with arm and bed. Used 60 cm wire per protocol without success.
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
## 2019-12-15 ENCOUNTER — Inpatient Hospital Stay (HOSPITAL_COMMUNITY): Payer: Medicaid Other

## 2019-12-15 DIAGNOSIS — Z515 Encounter for palliative care: Secondary | ICD-10-CM

## 2019-12-15 LAB — BASIC METABOLIC PANEL
Anion gap: 8 (ref 5–15)
BUN: 23 mg/dL — ABNORMAL HIGH (ref 6–20)
CO2: 26 mmol/L (ref 22–32)
Calcium: 7.9 mg/dL — ABNORMAL LOW (ref 8.9–10.3)
Chloride: 102 mmol/L (ref 98–111)
Creatinine, Ser: 0.84 mg/dL (ref 0.61–1.24)
GFR calc Af Amer: >60 mL/min (ref >60)
GFR calc non Af Amer: >60 mL/min (ref >60)
Glucose, Bld: 101 mg/dL — ABNORMAL HIGH (ref 70–99)
Potassium: 3.3 mmol/L — ABNORMAL LOW (ref 3.5–5.1)
Sodium: 136 mmol/L (ref 135–145)

## 2019-12-15 LAB — CBC
HCT: 27 % — ABNORMAL LOW (ref 39.0–52.0)
Hemoglobin: 9.1 g/dL — ABNORMAL LOW (ref 13.0–17.0)
MCH: 31.1 pg (ref 26.0–34.0)
MCHC: 33.7 g/dL (ref 30.0–36.0)
MCV: 92.2 fL (ref 80.0–100.0)
Platelets: 158 10*3/uL (ref 150–400)
RBC: 2.93 MIL/uL — ABNORMAL LOW (ref 4.22–5.81)
RDW: 13 % (ref 11.5–15.5)
WBC: 10.1 10*3/uL (ref 4.0–10.5)
nRBC: 0 % (ref 0.0–0.2)

## 2019-12-15 LAB — GLUCOSE, CAPILLARY
Glucose-Capillary: 102 mg/dL — ABNORMAL HIGH (ref 70–99)
Glucose-Capillary: 105 mg/dL — ABNORMAL HIGH (ref 70–99)
Glucose-Capillary: 105 mg/dL — ABNORMAL HIGH (ref 70–99)
Glucose-Capillary: 122 mg/dL — ABNORMAL HIGH (ref 70–99)
Glucose-Capillary: 88 mg/dL (ref 70–99)
Glucose-Capillary: 93 mg/dL (ref 70–99)

## 2019-12-15 MED ORDER — HALOPERIDOL LACTATE 5 MG/ML IJ SOLN
5.0000 mg | Freq: Four times a day (QID) | INTRAMUSCULAR | Status: DC | PRN
  Administered 2019-12-15 – 2019-12-16 (×5): 5 mg via INTRAVENOUS
  Filled 2019-12-15 (×6): qty 1

## 2019-12-15 MED ORDER — ORAL CARE MOUTH RINSE
15.0000 mL | Freq: Two times a day (BID) | OROMUCOSAL | Status: DC
  Administered 2019-12-15 – 2019-12-20 (×10): 15 mL via OROMUCOSAL

## 2019-12-15 MED ORDER — POTASSIUM CHLORIDE 20 MEQ/15ML (10%) PO SOLN
40.0000 meq | ORAL | Status: AC
  Administered 2019-12-15 (×2): 40 meq
  Filled 2019-12-15 (×2): qty 30

## 2019-12-15 MED ORDER — SODIUM CHLORIDE 0.9% FLUSH
10.0000 mL | Freq: Two times a day (BID) | INTRAVENOUS | Status: DC
  Administered 2019-12-15 – 2019-12-21 (×10): 10 mL

## 2019-12-15 MED ORDER — HYDROMORPHONE HCL 1 MG/ML IJ SOLN
2.0000 mg | Freq: Once | INTRAMUSCULAR | Status: AC
  Administered 2019-12-15: 2 mg via INTRAVENOUS
  Filled 2019-12-15: qty 2

## 2019-12-15 MED ORDER — CHLORHEXIDINE GLUCONATE 0.12 % MT SOLN
15.0000 mL | Freq: Two times a day (BID) | OROMUCOSAL | Status: DC
  Administered 2019-12-15 – 2019-12-21 (×13): 15 mL via OROMUCOSAL
  Filled 2019-12-15 (×10): qty 15

## 2019-12-15 MED ORDER — SODIUM CHLORIDE 0.9% FLUSH: 10.0000 mL | INTRAVENOUS | Status: DC | PRN

## 2019-12-15 MED ORDER — OXYCODONE HCL ER 15 MG PO T12A
30.0000 mg | EXTENDED_RELEASE_TABLET | Freq: Two times a day (BID) | ORAL | Status: DC
  Administered 2019-12-15 – 2019-12-25 (×20): 30 mg via ORAL
  Filled 2019-12-15 (×8): qty 2
  Filled 2019-12-15: qty 3
  Filled 2019-12-15 (×12): qty 2
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
## 2019-12-15 NOTE — Progress Notes (Signed)
Pts order is BIPAP PRN. Pt respiratory status is stable on 10 Lpm HFNC [place]. Pt in no distress at this time. RT will continue to monitor.

## 2019-12-15 NOTE — Progress Notes (Signed)
RT note: RT instructed patient with use of flutter valve. Patient displayed good effort with productive cough post use.
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
## 2019-12-16 ENCOUNTER — Inpatient Hospital Stay (HOSPITAL_COMMUNITY): Payer: Medicaid Other

## 2019-12-16 LAB — CBC
HCT: 27.7 % — ABNORMAL LOW (ref 39.0–52.0)
Hemoglobin: 9.3 g/dL — ABNORMAL LOW (ref 13.0–17.0)
MCH: 30.5 pg (ref 26.0–34.0)
MCHC: 33.6 g/dL (ref 30.0–36.0)
MCV: 90.8 fL (ref 80.0–100.0)
Platelets: 158 10*3/uL (ref 150–400)
RBC: 3.05 MIL/uL — ABNORMAL LOW (ref 4.22–5.81)
RDW: 12.8 % (ref 11.5–15.5)
WBC: 9.8 10*3/uL (ref 4.0–10.5)
nRBC: 0 % (ref 0.0–0.2)

## 2019-12-16 LAB — BASIC METABOLIC PANEL
Anion gap: 10 (ref 5–15)
BUN: 21 mg/dL — ABNORMAL HIGH (ref 6–20)
CO2: 24 mmol/L (ref 22–32)
Calcium: 8 mg/dL — ABNORMAL LOW (ref 8.9–10.3)
Chloride: 99 mmol/L (ref 98–111)
Creatinine, Ser: 0.82 mg/dL (ref 0.61–1.24)
GFR calc Af Amer: >60 mL/min (ref >60)
GFR calc non Af Amer: >60 mL/min (ref >60)
Glucose, Bld: 100 mg/dL — ABNORMAL HIGH (ref 70–99)
Potassium: 3.1 mmol/L — ABNORMAL LOW (ref 3.5–5.1)
Sodium: 133 mmol/L — ABNORMAL LOW (ref 135–145)

## 2019-12-16 LAB — GLUCOSE, CAPILLARY
Glucose-Capillary: 91 mg/dL (ref 70–99)
Glucose-Capillary: 90 mg/dL (ref 70–99)
Glucose-Capillary: 95 mg/dL (ref 70–99)
Glucose-Capillary: 104 mg/dL — ABNORMAL HIGH (ref 70–99)
Glucose-Capillary: 100 mg/dL — ABNORMAL HIGH (ref 70–99)
Glucose-Capillary: 87 mg/dL (ref 70–99)

## 2019-12-16 MED ORDER — OXYCODONE HCL 5 MG PO TABS
10.0000 mg | ORAL_TABLET | ORAL | Status: DC | PRN
  Administered 2019-12-18: 15 mg via ORAL
  Administered 2019-12-19: 10 mg via ORAL
  Administered 2019-12-19 (×2): 15 mg via ORAL
  Administered 2019-12-20 (×2): 10 mg via ORAL
  Administered 2019-12-21 – 2019-12-25 (×10): 15 mg via ORAL
  Filled 2019-12-16 (×5): qty 3
  Filled 2019-12-16: qty 2
  Filled 2019-12-16 (×2): qty 3
  Filled 2019-12-16: qty 2
  Filled 2019-12-16 (×6): qty 3
  Filled 2019-12-16: qty 2
  Filled 2019-12-16: qty 3

## 2019-12-16 MED ORDER — HYDROCHLOROTHIAZIDE 12.5 MG PO CAPS
12.5000 mg | ORAL_CAPSULE | Freq: Every day | ORAL | Status: DC
  Administered 2019-12-16 – 2019-12-25 (×10): 12.5 mg via ORAL
  Filled 2019-12-16 (×10): qty 1

## 2019-12-16 MED ORDER — ACETAMINOPHEN 160 MG/5ML PO SOLN: 650.0000 mg | Freq: Four times a day (QID) | ORAL | Status: DC

## 2019-12-16 MED ORDER — BISACODYL 10 MG RE SUPP: 10.0000 mg | Freq: Every day | RECTAL | Status: DC | PRN

## 2019-12-16 MED ORDER — TRAMADOL HCL 50 MG PO TABS
50.0000 mg | ORAL_TABLET | Freq: Four times a day (QID) | ORAL | Status: DC
  Administered 2019-12-16 – 2019-12-25 (×37): 50 mg via ORAL
  Filled 2019-12-16 (×37): qty 1

## 2019-12-16 MED ORDER — SUCRALFATE 1 G PO TABS
1.0000 g | ORAL_TABLET | Freq: Three times a day (TID) | ORAL | Status: DC
  Administered 2019-12-16 – 2019-12-25 (×37): 1 g via ORAL
  Filled 2019-12-16 (×38): qty 1

## 2019-12-16 MED ORDER — POTASSIUM CHLORIDE CRYS ER 20 MEQ PO TBCR
40.0000 meq | EXTENDED_RELEASE_TABLET | Freq: Two times a day (BID) | ORAL | Status: DC
  Administered 2019-12-16 – 2019-12-25 (×19): 40 meq via ORAL
  Filled 2019-12-16 (×19): qty 2

## 2019-12-16 MED ORDER — POLYETHYLENE GLYCOL 3350 17 G PO PACK
17.0000 g | PACK | Freq: Every day | ORAL | Status: DC
  Administered 2019-12-16 – 2019-12-25 (×6): 17 g via ORAL
  Filled 2019-12-16 (×9): qty 1

## 2019-12-16 MED ORDER — ACETAMINOPHEN 325 MG PO TABS
650.0000 mg | ORAL_TABLET | Freq: Four times a day (QID) | ORAL | Status: DC
  Administered 2019-12-16 – 2019-12-25 (×36): 650 mg via ORAL
  Filled 2019-12-16 (×36): qty 2

## 2019-12-16 MED ORDER — ROSUVASTATIN CALCIUM 20 MG PO TABS
40.0000 mg | ORAL_TABLET | Freq: Every day | ORAL | Status: DC
  Administered 2019-12-16 – 2019-12-25 (×10): 40 mg via ORAL
  Filled 2019-12-16 (×10): qty 2

## 2019-12-16 MED ORDER — GABAPENTIN 300 MG PO CAPS
600.0000 mg | ORAL_CAPSULE | Freq: Three times a day (TID) | ORAL | Status: DC
  Administered 2019-12-16 – 2019-12-25 (×28): 600 mg via ORAL
  Filled 2019-12-16 (×28): qty 2

## 2019-12-16 MED ORDER — METHOCARBAMOL 500 MG PO TABS
1000.0000 mg | ORAL_TABLET | Freq: Three times a day (TID) | ORAL | Status: DC
  Administered 2019-12-16 – 2019-12-25 (×27): 1000 mg via ORAL
  Filled 2019-12-16 (×27): qty 2

## 2019-12-16 MED ORDER — PANTOPRAZOLE SODIUM 40 MG PO TBEC
40.0000 mg | DELAYED_RELEASE_TABLET | Freq: Every day | ORAL | Status: DC
  Administered 2019-12-16 – 2019-12-25 (×10): 40 mg via ORAL
  Filled 2019-12-16 (×10): qty 1

## 2019-12-16 MED ORDER — NALOXEGOL OXALATE 25 MG PO TABS
25.0000 mg | ORAL_TABLET | Freq: Every day | ORAL | Status: DC
  Administered 2019-12-16 – 2019-12-25 (×10): 25 mg via ORAL
  Filled 2019-12-16 (×10): qty 1

## 2019-12-16 MED ORDER — LISINOPRIL 20 MG PO TABS
20.0000 mg | ORAL_TABLET | Freq: Every day | ORAL | Status: DC
  Administered 2019-12-16 – 2019-12-25 (×10): 20 mg via ORAL
  Filled 2019-12-16 (×10): qty 1

## 2019-12-16 MED ORDER — DOCUSATE SODIUM 100 MG PO CAPS
100.0000 mg | ORAL_CAPSULE | Freq: Two times a day (BID) | ORAL | Status: DC
  Administered 2019-12-16 – 2019-12-25 (×17): 100 mg via ORAL
  Filled 2019-12-16 (×19): qty 1

## 2019-12-16 MED ORDER — SODIUM CHLORIDE 0.9 % IV SOLN
INTRAVENOUS | Status: DC | PRN
  Administered 2019-12-16: 500 mL via INTRAVENOUS
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
## 2019-12-17 ENCOUNTER — Inpatient Hospital Stay (HOSPITAL_COMMUNITY): Payer: Medicaid Other

## 2019-12-17 LAB — BASIC METABOLIC PANEL
Anion gap: 15 (ref 5–15)
BUN: 24 mg/dL — ABNORMAL HIGH (ref 6–20)
CO2: 21 mmol/L — ABNORMAL LOW (ref 22–32)
Calcium: 8.5 mg/dL — ABNORMAL LOW (ref 8.9–10.3)
Chloride: 100 mmol/L (ref 98–111)
Creatinine, Ser: 0.74 mg/dL (ref 0.61–1.24)
GFR calc Af Amer: >60 mL/min (ref >60)
GFR calc non Af Amer: >60 mL/min (ref >60)
Glucose, Bld: 96 mg/dL (ref 70–99)
Potassium: 3.7 mmol/L (ref 3.5–5.1)
Sodium: 136 mmol/L (ref 135–145)

## 2019-12-17 LAB — GLUCOSE, CAPILLARY
Glucose-Capillary: 87 mg/dL (ref 70–99)
Glucose-Capillary: 92 mg/dL (ref 70–99)
Glucose-Capillary: 97 mg/dL (ref 70–99)
Glucose-Capillary: 96 mg/dL (ref 70–99)
Glucose-Capillary: 97 mg/dL (ref 70–99)

## 2019-12-17 LAB — CBC
HCT: 29 % — ABNORMAL LOW (ref 39.0–52.0)
Hemoglobin: 9.8 g/dL — ABNORMAL LOW (ref 13.0–17.0)
MCH: 30.9 pg (ref 26.0–34.0)
MCHC: 33.8 g/dL (ref 30.0–36.0)
MCV: 91.5 fL (ref 80.0–100.0)
Platelets: 178 10*3/uL (ref 150–400)
RBC: 3.17 MIL/uL — ABNORMAL LOW (ref 4.22–5.81)
RDW: 12.8 % (ref 11.5–15.5)
WBC: 11.7 10*3/uL — ABNORMAL HIGH (ref 4.0–10.5)
nRBC: 0 % (ref 0.0–0.2)

## 2019-12-17 MED ORDER — HYDROMORPHONE HCL 1 MG/ML IJ SOLN
0.5000 mg | Freq: Three times a day (TID) | INTRAMUSCULAR | Status: DC | PRN
  Administered 2019-12-18: 0.5 mg via INTRAVENOUS
  Filled 2019-12-17 (×2): qty 1

## 2019-12-17 MED ORDER — POTASSIUM CHLORIDE 20 MEQ/15ML (10%) PO SOLN
40.0000 meq | Freq: Once | ORAL | Status: AC
  Administered 2019-12-17: 40 meq
  Filled 2019-12-17: qty 30
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
## 2019-12-17 NOTE — Progress Notes (Signed)
Trauma/Critical Care Follow Up Note  Subjective:    Overnight Issues:   Objective:  Vital signs for last 24 hours: Temp:  [97.7 F (36.5 C)-98.8 F (37.1 C)] 98.8 F (37.1 C) (06/13 0800) Pulse Rate:  [89-110] 99 (06/13 0900) Resp:  [20-50] 33 (06/13 0900) BP: (116-175)/(43-95) 140/59 (06/13 0900) SpO2:  [89 %-100 %] 97 % (06/13 0900) Weight:  [97.4 kg] 97.4 kg (06/13 0500)  Hemodynamic parameters for last 24 hours:    Intake/Output from previous day: 06/12 0701 - 06/13 0700 In: 367.8 [I.V.:67.8; IV Piggyback:300] Out: 1700 [Urine:1700]  Intake/Output this shift: No intake/output data recorded.  Vent settings for last 24 hours:    Physical Exam:  Gen: comfortable, no distress Neuro: non-focal exam HEENT: PERRL Neck: supple CV: RRR Pulm: unlabored breathing Abd: soft, NT, less distended, tympanitic GU: clear yellow urine Extr: wwp, no edema   Results for orders placed or performed during the hospital encounter of 12/10/19 (from the past 24 hour(s))  Glucose, capillary     Status: None   Collection Time: 12/16/19 11:44 AM  Result Value Ref Range   Glucose-Capillary 95 70 - 99 mg/dL  CBC     Status: Abnormal   Collection Time: 12/16/19 11:46 AM  Result Value Ref Range   WBC 9.8 4.0 - 10.5 K/uL   RBC 3.05 (L) 4.22 - 5.81 MIL/uL   Hemoglobin 9.3 (L) 13.0 - 17.0 g/dL   HCT 27.7 (L) 39 - 52 %   MCV 90.8 80.0 - 100.0 fL   MCH 30.5 26.0 - 34.0 pg   MCHC 33.6 30.0 - 36.0 g/dL   RDW 12.8 11.5 - 15.5 %   Platelets 158 150 - 400 K/uL   nRBC 0.0 0.0 - 0.2 %  Basic metabolic panel     Status: Abnormal   Collection Time: 12/16/19 11:46 AM  Result Value Ref Range   Sodium 133 (L) 135 - 145 mmol/L   Potassium 3.1 (L) 3.5 - 5.1 mmol/L   Chloride 99 98 - 111 mmol/L   CO2 24 22 - 32 mmol/L   Glucose, Bld 100 (H) 70 - 99 mg/dL   BUN 21 (H) 6 - 20 mg/dL   Creatinine, Ser 0.82 0.61 - 1.24 mg/dL   Calcium 8.0 (L) 8.9 - 10.3 mg/dL   GFR calc non Af Amer >60 >60  mL/min   GFR calc Af Amer >60 >60 mL/min   Anion gap 10 5 - 15  Glucose, capillary     Status: Abnormal   Collection Time: 12/16/19  3:22 PM  Result Value Ref Range   Glucose-Capillary 104 (H) 70 - 99 mg/dL  Glucose, capillary     Status: Abnormal   Collection Time: 12/16/19  7:40 PM  Result Value Ref Range   Glucose-Capillary 100 (H) 70 - 99 mg/dL  Glucose, capillary     Status: None   Collection Time: 12/16/19 11:13 PM  Result Value Ref Range   Glucose-Capillary 87 70 - 99 mg/dL  Glucose, capillary     Status: None   Collection Time: 12/17/19  3:45 AM  Result Value Ref Range   Glucose-Capillary 87 70 - 99 mg/dL  Glucose, capillary     Status: None   Collection Time: 12/17/19  7:43 AM  Result Value Ref Range   Glucose-Capillary 92 70 - 99 mg/dL    Assessment & Plan: The plan of care was discussed with the bedside nurse for the day, who is in agreement with this plan  and no additional concerns were raised.   Present on Admission: **None**    LOS: 7 days   Additional comments:I reviewed the patient's new clinical lab test results.   and I reviewed the patients new imaging test results.    TBI/falcine SDH/R SAH- NSGY c/s (Dr. Maurice Small), no repeat head CT indicated, SLP eval, keppra x7d for sz ppx R rib FXs 6-8, L rib FXs 2-8 with L PTX- L chest tube pulled out yesterday after being retracted out of the pleural space, CXR today pending  Acute hypoxic respiratory failure-now on RA, cont pulm toilet L clavicle fx- ortho c/s (Dr. Charlann Boxer), nonop mgmt with sling L scapula fx - ortho c/s (Dr. Charlann Boxer), nonop mgmt with sling Multiple abrasions to head, face, and extremities- xeroform, local wound care  T3 and T9 SP fx- pain control T12 compression fx- NSGY c/s (Dr. Maurice Small), no surgery, no brace R L4 transverse process fracture- pain control Chronic pain- restarted home meds, oxycontinadded Hypertension- restarted home lisinopril/HCTZ, PRN metoprolol FEN- regular diet  today DVT- SCDs, LMWH Dispo- TTF  Diamantina Monks, MD Trauma & General Surgery Please use [email or web address] to contact on call provider  12/17/2019  *Care during the described time interval was provided by me. I have reviewed this patient's available data, including medical history, events of note, physical examination and test results as part of my evaluation.

## 2019-12-17 NOTE — Progress Notes (Signed)
Dr Bedelia Person is aware about pt's status prior to transfer

## 2019-12-18 LAB — CBC
HCT: 29.7 % — ABNORMAL LOW (ref 39.0–52.0)
Hemoglobin: 10 g/dL — ABNORMAL LOW (ref 13.0–17.0)
MCH: 30.8 pg (ref 26.0–34.0)
MCHC: 33.7 g/dL (ref 30.0–36.0)
MCV: 91.4 fL (ref 80.0–100.0)
Platelets: 202 10*3/uL (ref 150–400)
RBC: 3.25 MIL/uL — ABNORMAL LOW (ref 4.22–5.81)
RDW: 13 % (ref 11.5–15.5)
WBC: 13.8 10*3/uL — ABNORMAL HIGH (ref 4.0–10.5)
nRBC: 0 % (ref 0.0–0.2)

## 2019-12-18 LAB — GLUCOSE, CAPILLARY
Glucose-Capillary: 98 mg/dL (ref 70–99)
Glucose-Capillary: 121 mg/dL — ABNORMAL HIGH (ref 70–99)
Glucose-Capillary: 175 mg/dL — ABNORMAL HIGH (ref 70–99)
Glucose-Capillary: 107 mg/dL — ABNORMAL HIGH (ref 70–99)

## 2019-12-18 LAB — BASIC METABOLIC PANEL
Anion gap: 12 (ref 5–15)
BUN: 28 mg/dL — ABNORMAL HIGH (ref 6–20)
CO2: 20 mmol/L — ABNORMAL LOW (ref 22–32)
Calcium: 8.4 mg/dL — ABNORMAL LOW (ref 8.9–10.3)
Chloride: 106 mmol/L (ref 98–111)
Creatinine, Ser: 0.68 mg/dL (ref 0.61–1.24)
GFR calc Af Amer: >60 mL/min (ref >60)
GFR calc non Af Amer: >60 mL/min (ref >60)
Glucose, Bld: 121 mg/dL — ABNORMAL HIGH (ref 70–99)
Potassium: 4 mmol/L (ref 3.5–5.1)
Sodium: 138 mmol/L (ref 135–145)

## 2019-12-18 NOTE — Plan of Care (Signed)
  Problem: Education: Goal: Knowledge of General Education information will improve Description Including pain rating scale, medication(s)/side effects and non-pharmacologic comfort measures Outcome: Progressing   
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
## 2019-12-18 NOTE — Progress Notes (Signed)
Trauma/Critical Care Follow Up Note  Subjective:    Overnight Issues:  NAEO. C/o back pain and left arm pain that improves with medication. Reports pulling 750 on IS. usure when he last got out of bed. Denies issues with urination. Having BMs using bed pan.   Still requesting IV pain medicine. HM x2 yesterday and once around 0100.  Objective:  Vital signs for last 24 hours: Temp:  [96.4 F (35.8 C)-98.2 F (36.8 C)] 98.1 F (36.7 C) (06/14 0353) Pulse Rate:  [93-104] 93 (06/14 0353) Resp:  [28-36] 32 (06/14 0353) BP: (119-157)/(47-86) 137/66 (06/14 0353) SpO2:  [90 %-98 %] 95 % (06/14 0353) Weight:  [92.9 kg] 92.9 kg (06/14 0500)  Hemodynamic parameters for last 24 hours:    Intake/Output from previous day: 06/13 0701 - 06/14 0700 In: 580 [P.O.:480; IV Piggyback:100] Out: 700 [Urine:700]  Intake/Output this shift: No intake/output data recorded.  Vent settings for last 24 hours:    Physical Exam:  Gen: comfortable, eyes closed, no distress Neuro: non-focal exam HEENT: PERRL Neck: supple CV: RRR Pulm: unlabored breathing 2L [place], respiratory splinting, CTAB with diminished breath sounds bilateral lung bases, pulled 250 cc on IS for me.  Abd: soft, NT, less distended, tympanitic GU: clear yellow urine Extr: wwp, no edema  Results for orders placed or performed during the hospital encounter of 12/10/19 (from the past 24 hour(s))  CBC     Status: Abnormal   Collection Time: 12/17/19  9:13 AM  Result Value Ref Range   WBC 11.7 (H) 4.0 - 10.5 K/uL   RBC 3.17 (L) 4.22 - 5.81 MIL/uL   Hemoglobin 9.8 (L) 13.0 - 17.0 g/dL   HCT 29.0 (L) 39 - 52 %   MCV 91.5 80.0 - 100.0 fL   MCH 30.9 26.0 - 34.0 pg   MCHC 33.8 30.0 - 36.0 g/dL   RDW 12.8 11.5 - 15.5 %   Platelets 178 150 - 400 K/uL   nRBC 0.0 0.0 - 0.2 %  Basic metabolic panel     Status: Abnormal   Collection Time: 12/17/19  9:13 AM  Result Value Ref Range   Sodium 136 135 - 145 mmol/L   Potassium 3.7 3.5 - 5.1  mmol/L   Chloride 100 98 - 111 mmol/L   CO2 21 (L) 22 - 32 mmol/L   Glucose, Bld 96 70 - 99 mg/dL   BUN 24 (H) 6 - 20 mg/dL   Creatinine, Ser 0.74 0.61 - 1.24 mg/dL   Calcium 8.5 (L) 8.9 - 10.3 mg/dL   GFR calc non Af Amer >60 >60 mL/min   GFR calc Af Amer >60 >60 mL/min   Anion gap 15 5 - 15  Glucose, capillary     Status: None   Collection Time: 12/17/19 11:29 AM  Result Value Ref Range   Glucose-Capillary 97 70 - 99 mg/dL  Glucose, capillary     Status: None   Collection Time: 12/17/19  4:52 PM  Result Value Ref Range   Glucose-Capillary 96 70 - 99 mg/dL  Glucose, capillary     Status: None   Collection Time: 12/17/19  9:55 PM  Result Value Ref Range   Glucose-Capillary 97 70 - 99 mg/dL  Glucose, capillary     Status: None   Collection Time: 12/18/19  7:30 AM  Result Value Ref Range   Glucose-Capillary 98 70 - 99 mg/dL    Assessment & Plan: The plan of care was discussed with the bedside nurse for  the day, who is in agreement with this plan and no additional concerns were raised.   Present on Admission: **None**    LOS: 8 days   Additional comments:I reviewed the patient's new clinical lab test results.   and I reviewed the patients new imaging test results.    TBI/falcine SDH/R SAH- NSGY c/s (Dr. Maurice Small), no repeat head CT indicated, SLP eval, keppra x7d for sz ppx R rib FXs 6-8, L rib FXs 2-8 with L PTX- L chest tube pulled out 6/12 after being retracted out of the pleural space, CXR 6/13 without PTX, stable left pulm contusion Acute hypoxic respiratory failure-now on RA, cont pulm toilet  L clavicle fx- ortho c/s (Dr. Charlann Boxer), nonop mgmt with sling L scapula fx - ortho c/s (Dr. Charlann Boxer), nonop mgmt with sling Multiple abrasions to head, face, and extremities- xeroform, local wound care  T3 and T9 SP fx- pain control T12 compression fx- NSGY c/s (Dr. Maurice Small), no surgery, no brace R L4 transverse process fracture- pain control Chronic pain- scheduled  APAP, oxycodone 10-15 mg q4 PRN (takes 30 mg q6h at home), gabapentin 600 mg q 8h (home dose), robaxin 1,000 mg q 8h, traMADol 50 mg q 6h, oxycontin30 mg added Hypertension- home lisinopril/HCTZ, PRN metoprolol FEN- regular diet  DVT- SCDs, LMWH Dispo- floor, PT/OT evals, D/C IV pain medication and maximize PO PRN medications, wean O2 to room air   Hosie Spangle, PA-C  Trauma & General Surgery Please use [email or web address] to contact on call provider  12/18/2019  *Care during the described time interval was provided by me. I have reviewed this patient's available data, including medical history, events of note, physical examination and test results as part of my evaluation.

## 2019-12-18 NOTE — Progress Notes (Addendum)
   12/17/19 2216  Assess: MEWS Score  Resp (!) 32  Level of Consciousness Alert  O2 Device Room Air  Assess: MEWS Score  MEWS Temp 0  MEWS Systolic 0  MEWS Pulse 0  MEWS RR 2  MEWS LOC 0  MEWS Score 2  MEWS Score Color Yellow  Assess: if the MEWS score is Yellow or Red  Were vital signs taken at a resting state? Yes  Focused Assessment Documented focused assessment  Early Detection of Sepsis Score *See Row Information* Low  MEWS guidelines implemented *See Row Information* No, previously yellow, continue vital signs every 4 hours  Treat  MEWS Interventions Administered prn meds/treatments;Escalated (See documentation below);Other (Comment) Psychologist, occupational called Rapid Response)  Take Vital Signs  Increase Vital Sign Frequency  Yellow: Q 2hr X 2 then Q 4hr X 2, if remains yellow, continue Q 4hrs  Escalate  MEWS: Escalate Yellow: discuss with charge nurse/RN and consider discussing with provider and RRT  Notify: Charge Nurse/RN  Name of Charge Nurse/RN Notified Nellie R, RN  Date Charge Nurse/RN Notified 12/17/19  Time Charge Nurse/RN Notified 2216  Notify: Rapid Response  Name of Rapid Response RN Notified Nikki  Date Rapid Response Notified 12/17/19  Time Rapid Response Notified 2216   Per Rapid Response RN Lowella Bandy, patient has a traumatic brain injury and his respirations are expected to be high. She stated that he "looked good, but we just need to stay on top of his pain". Medicated for pain. Gave bed bath, linen change, and repositioned for comfort. 12/17/2019 @ 0638 Manson Allan, Rn

## 2019-12-18 NOTE — Significant Event (Signed)
Rapid Response Event Note  Overview:  Called by RN to assess pt they were concerned about with MEWS of 3 and pt confused/impulsive pulling at lines.    Initial Focused Assessment: On arrival, pt sitting in bed with no acute distress noted. A&O, calm, followed commands. HR 98, BP 119/70, spO2 96% on RA, RR 30 with shallow breathing noted. Pt c/o pain 10/10 to ribs, back, and shoulders.   Interventions: Scheduled night time pain meds given   RN educated on proper pain control with multiple fx's and pt not taking deep breathes d/t pain. I believe this will help with tachypnea.   Plan of Care (if not transferred): Continue to monitor pt vitals and pain control.  RN instructed to give scheduled meds and reassess pain in an hour before giving PRN meds to prevent hypotension.   Please call with any changes or concerns.  Event Summary:  called at  2144  event ended at  2210       Mordecai Rasmussen

## 2019-12-18 NOTE — TOC CAGE-AID Note (Signed)
Transition of Care [facility]) - CAGE-AID Screening   Patient Details  Name: Eric Blake MRN: [id number] Date of Birth: May 30, 1966  [facility]) CM/SW Contact:    Jimmy Picket, LCSWA Phone Number: 12/18/2019, 12:20 PM   Clinical Narrative:  Pt denied alcohol use and substance use.   CAGE-AID Screening: Substance Abuse Screening unable to be completed due to: : Patient unable to participate  Have You Ever Felt You Ought to Cut Down on Your Drinking or Drug Use?: No Have People Annoyed You By Critizing Your Drinking Or Drug Use?: No Have You Felt Bad Or Guilty About Your Drinking Or Drug Use?: No Have You Ever Had a Drink or Used Drugs First Thing In The Morning to STeady Your Nerves or to Get Rid of a Hangover?: No CAGE-AID Score: 0  Substance Abuse Education Offered: Yes    Denita Lung, Bridget Hartshorn Clinical Social Worker [phone]

## 2019-12-19 ENCOUNTER — Inpatient Hospital Stay (HOSPITAL_COMMUNITY): Payer: Medicaid Other

## 2019-12-19 ENCOUNTER — Encounter (HOSPITAL_COMMUNITY): Payer: Self-pay | Admitting: General Surgery

## 2019-12-19 DIAGNOSIS — I6200 Nontraumatic subdural hemorrhage, unspecified: Secondary | ICD-10-CM

## 2019-12-19 LAB — CBC
HCT: 30.3 % — ABNORMAL LOW (ref 39.0–52.0)
Hemoglobin: 10.2 g/dL — ABNORMAL LOW (ref 13.0–17.0)
MCH: 30.7 pg (ref 26.0–34.0)
MCHC: 33.7 g/dL (ref 30.0–36.0)
MCV: 91.3 fL (ref 80.0–100.0)
Platelets: 230 10*3/uL (ref 150–400)
RBC: 3.32 MIL/uL — ABNORMAL LOW (ref 4.22–5.81)
RDW: 13 % (ref 11.5–15.5)
WBC: 18.7 10*3/uL — ABNORMAL HIGH (ref 4.0–10.5)
nRBC: 0.1 % (ref 0.0–0.2)

## 2019-12-19 LAB — GLUCOSE, CAPILLARY
Glucose-Capillary: 120 mg/dL — ABNORMAL HIGH (ref 70–99)
Glucose-Capillary: 121 mg/dL — ABNORMAL HIGH (ref 70–99)
Glucose-Capillary: 93 mg/dL (ref 70–99)
Glucose-Capillary: 114 mg/dL — ABNORMAL HIGH (ref 70–99)

## 2019-12-19 LAB — BASIC METABOLIC PANEL
Anion gap: 12 (ref 5–15)
BUN: 22 mg/dL — ABNORMAL HIGH (ref 6–20)
CO2: 21 mmol/L — ABNORMAL LOW (ref 22–32)
Calcium: 8.5 mg/dL — ABNORMAL LOW (ref 8.9–10.3)
Chloride: 103 mmol/L (ref 98–111)
Creatinine, Ser: 0.65 mg/dL (ref 0.61–1.24)
GFR calc Af Amer: >60 mL/min (ref >60)
GFR calc non Af Amer: >60 mL/min (ref >60)
Glucose, Bld: 107 mg/dL — ABNORMAL HIGH (ref 70–99)
Potassium: 4.1 mmol/L (ref 3.5–5.1)
Sodium: 136 mmol/L (ref 135–145)

## 2019-12-19 MED ORDER — ENSURE ENLIVE PO LIQD
237.0000 mL | Freq: Three times a day (TID) | ORAL | Status: DC
  Administered 2019-12-19 – 2019-12-25 (×6): 237 mL via ORAL

## 2019-12-19 MED ORDER — ADULT MULTIVITAMIN W/MINERALS CH
1.0000 | ORAL_TABLET | Freq: Every day | ORAL | Status: DC
  Administered 2019-12-19 – 2019-12-25 (×7): 1 via ORAL
  Filled 2019-12-19 (×7): qty 1
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
## 2019-12-19 NOTE — Progress Notes (Signed)
Inpatient Rehab Admissions:  Inpatient Rehab Consult received.  I met with pt at the bedside as follow up from PM&R MD consult (please see consult note from Dr. Ranell Patrick on 6/15 for details). AC reviewed program details, expectations of CIR program, anticipated LOS, and expected functional outcomes. Pt is agreeable to rehab. With his permission I spoke to his wife Nira Conn via phone. She confirmed DC support and interest in this program as well.   AC will follow for bed availability in CIR. Will update once I have a bed open for this patient.   Raechel Ache, OTR/L  Rehab Admissions Coordinator  [phone] 12/19/2019 4:30 PM

## 2019-12-19 NOTE — Plan of Care (Signed)
  Problem: Coping: Goal: Level of anxiety will decrease Outcome: Progressing   Problem: Elimination: Goal: Will not experience complications related to bowel motility Outcome: Progressing Goal: Will not experience complications related to urinary retention Outcome: Progressing   Problem: Skin Integrity: Goal: Risk for impaired skin integrity will decrease Outcome: Progressing   
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
## 2019-12-20 LAB — CBC
HCT: 31.6 % — ABNORMAL LOW (ref 39.0–52.0)
Hemoglobin: 10.3 g/dL — ABNORMAL LOW (ref 13.0–17.0)
MCH: 30.4 pg (ref 26.0–34.0)
MCHC: 32.6 g/dL (ref 30.0–36.0)
MCV: 93.2 fL (ref 80.0–100.0)
Platelets: 263 10*3/uL (ref 150–400)
RBC: 3.39 MIL/uL — ABNORMAL LOW (ref 4.22–5.81)
RDW: 13.3 % (ref 11.5–15.5)
WBC: 19.3 10*3/uL — ABNORMAL HIGH (ref 4.0–10.5)
nRBC: 0 % (ref 0.0–0.2)

## 2019-12-20 LAB — URINALYSIS, ROUTINE W REFLEX MICROSCOPIC
Bilirubin Urine: NEGATIVE
Glucose, UA: NEGATIVE mg/dL
Hgb urine dipstick: NEGATIVE
Ketones, ur: NEGATIVE mg/dL
Leukocytes,Ua: NEGATIVE
Nitrite: NEGATIVE
Protein, ur: NEGATIVE mg/dL
Specific Gravity, Urine: 1.023 (ref 1.005–1.030)
pH: 5 (ref 5.0–8.0)

## 2019-12-20 LAB — BASIC METABOLIC PANEL
Anion gap: 10 (ref 5–15)
BUN: 21 mg/dL — ABNORMAL HIGH (ref 6–20)
CO2: 22 mmol/L (ref 22–32)
Calcium: 8.4 mg/dL — ABNORMAL LOW (ref 8.9–10.3)
Chloride: 102 mmol/L (ref 98–111)
Creatinine, Ser: 0.69 mg/dL (ref 0.61–1.24)
GFR calc Af Amer: >60 mL/min (ref >60)
GFR calc non Af Amer: >60 mL/min (ref >60)
Glucose, Bld: 115 mg/dL — ABNORMAL HIGH (ref 70–99)
Potassium: 4.5 mmol/L (ref 3.5–5.1)
Sodium: 134 mmol/L — ABNORMAL LOW (ref 135–145)

## 2019-12-20 LAB — GLUCOSE, CAPILLARY
Glucose-Capillary: 107 mg/dL — ABNORMAL HIGH (ref 70–99)
Glucose-Capillary: 148 mg/dL — ABNORMAL HIGH (ref 70–99)
Glucose-Capillary: 113 mg/dL — ABNORMAL HIGH (ref 70–99)
Glucose-Capillary: 175 mg/dL — ABNORMAL HIGH (ref 70–99)
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
## 2019-12-20 NOTE — Progress Notes (Signed)
SLP Cancellation Note  Patient Details Name: Eric Blake MRN: [id number] DOB: 23-Oct-1965   Cancelled treatment:       Reason Eval/Treat Not Completed: Patient at procedure or test/unavailable. Will continue efforts for cognitive treatment. Note plan for transfer to CIR pending bed availability.  Mekisha Bittel B. Murvin Natal, [facility], CCC-SLP Speech Language Pathologist Office: [phone] Pager: [phone]  Leigh Aurora 12/20/2019, 1:55 PM

## 2019-12-20 NOTE — Progress Notes (Signed)
Inpatient Rehabilitation-Admissions Coordinator   I do not have a bed available for this patient today. Will follow up tomorrow for possible admit, pending bed availability.   Cheri Rous, OTR/L  Rehab Admissions Coordinator  [phone] 12/20/2019 11:06 AM
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
## 2019-12-21 ENCOUNTER — Encounter (HOSPITAL_COMMUNITY): Payer: Medicaid Other

## 2019-12-21 LAB — CBC
HCT: 29.9 % — ABNORMAL LOW (ref 39.0–52.0)
Hemoglobin: 9.9 g/dL — ABNORMAL LOW (ref 13.0–17.0)
MCH: 31.1 pg (ref 26.0–34.0)
MCHC: 33.1 g/dL (ref 30.0–36.0)
MCV: 94 fL (ref 80.0–100.0)
Platelets: 258 10*3/uL (ref 150–400)
RBC: 3.18 MIL/uL — ABNORMAL LOW (ref 4.22–5.81)
RDW: 14 % (ref 11.5–15.5)
WBC: 19.5 10*3/uL — ABNORMAL HIGH (ref 4.0–10.5)
nRBC: 0 % (ref 0.0–0.2)

## 2019-12-21 LAB — GLUCOSE, CAPILLARY
Glucose-Capillary: 104 mg/dL — ABNORMAL HIGH (ref 70–99)
Glucose-Capillary: 180 mg/dL — ABNORMAL HIGH (ref 70–99)
Glucose-Capillary: 115 mg/dL — ABNORMAL HIGH (ref 70–99)
Glucose-Capillary: 112 mg/dL — ABNORMAL HIGH (ref 70–99)
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
## 2019-12-21 NOTE — Progress Notes (Signed)
Pt declined wound dressing change. Pt stated that yesterdays changing was "good enough". Pt stated that he would prefer dressing change every other day instead of daily as ordered. This RN reinforced the importance of the dressing change with wife at bedside, both declined. Night shift nurse made aware during report. Will continue to monitor pt.

## 2019-12-21 NOTE — Progress Notes (Signed)
Inpatient Rehabilitation-Admissions Coordinator   Still waiting on a bed opening for this patient. Will follow up tomorrow for possible admit, pending bed availability. Pt and his wife aware.   Cheri Rous, OTR/L  Rehab Admissions Coordinator  [phone] 12/21/2019 3:07 PM

## 2019-12-22 ENCOUNTER — Inpatient Hospital Stay (HOSPITAL_COMMUNITY): Payer: Medicaid Other

## 2019-12-22 ENCOUNTER — Encounter (HOSPITAL_COMMUNITY): Payer: Self-pay

## 2019-12-22 DIAGNOSIS — S2242XA Multiple fractures of ribs, left side, initial encounter for closed fracture: Secondary | ICD-10-CM

## 2019-12-22 LAB — GLUCOSE, CAPILLARY
Glucose-Capillary: 116 mg/dL — ABNORMAL HIGH (ref 70–99)
Glucose-Capillary: 171 mg/dL — ABNORMAL HIGH (ref 70–99)
Glucose-Capillary: 188 mg/dL — ABNORMAL HIGH (ref 70–99)
Glucose-Capillary: 144 mg/dL — ABNORMAL HIGH (ref 70–99)

## 2019-12-22 MED ORDER — GUAIFENESIN 100 MG/5ML PO SOLN
5.0000 mL | ORAL | Status: DC | PRN
  Administered 2019-12-22 (×2): 100 mg via ORAL
  Filled 2019-12-22 (×4): qty 5
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
## 2019-12-22 NOTE — Progress Notes (Signed)
Lower extremity venous has been completed.   Preliminary results in CV Proc.   Blanch Media 12/22/2019 2:02 PM
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
## 2019-12-22 NOTE — Progress Notes (Signed)
Inpatient Rehabilitation-Admissions Coordinator   I do not have any beds available for this patient today or this weekend. Discussed with pt and his wife. Pt and his wife did mention that they have plenty of support at home and may consider discharging straight home. Discussed that with his medicaid he may not have much follow up therapy at home. They would like to see how he does over the weekend prior to making a decision. AC will follow up with pt Monday for possible admit, pending bed availability and pt/family preference.   Cheri Rous, OTR/L  Rehab Admissions Coordinator  [phone] 12/22/2019 4:07 PM

## 2019-12-22 NOTE — Progress Notes (Signed)
   12/22/19 1900  Assess: MEWS Score  ECG Heart Rate (!) 102  Assess: MEWS Score  MEWS Temp 0  MEWS Systolic 1  MEWS Pulse 1  MEWS RR 0  MEWS LOC 0  MEWS Score 2  MEWS Score Color Yellow  Assess: if the MEWS score is Yellow or Red  Were vital signs taken at a resting state? Yes  Focused Assessment Documented focused assessment  Early Detection of Sepsis Score *See Row Information* Low  MEWS guidelines implemented *See Row Information* No, vital signs rechecked  Treat  MEWS Interventions Other (Comment) (VS rechecked and pt assessed)  Notify: Charge Nurse/RN  Name of Charge Nurse/RN Notified [place]  Date Charge Nurse/RN Notified 12/22/19  Time Charge Nurse/RN Notified 2127  Document  Patient Outcome Stabilized after interventions  Progress note created (see row info) Yes
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
## 2019-12-23 LAB — GLUCOSE, CAPILLARY
Glucose-Capillary: 120 mg/dL — ABNORMAL HIGH (ref 70–99)
Glucose-Capillary: 145 mg/dL — ABNORMAL HIGH (ref 70–99)
Glucose-Capillary: 143 mg/dL — ABNORMAL HIGH (ref 70–99)
Glucose-Capillary: 191 mg/dL — ABNORMAL HIGH (ref 70–99)

## 2019-12-23 MED ORDER — ZOLPIDEM TARTRATE 5 MG PO TABS
5.0000 mg | ORAL_TABLET | Freq: Every evening | ORAL | Status: DC | PRN
  Administered 2019-12-23: 5 mg via ORAL
  Filled 2019-12-23: qty 1

## 2019-12-23 NOTE — Progress Notes (Signed)
Trauma/Critical Care Follow Up Note  Subjective:    Overnight Issues:  Pain controlled. Walked in the hall and got OOB to [employer].  He asked about going home with [facility] nurse/PT since inpat rehab does not have a bed  Objective:  Vital signs for last 24 hours: Temp:  [98.3 F (36.8 C)-99 F (37.2 C)] 98.3 F (36.8 C) (06/19 0500) Pulse Rate:  [68-97] 68 (06/19 0500) Resp:  [17-18] 18 (06/19 0500) BP: (98-99)/(60-81) 98/60 (06/19 0500) SpO2:  [94 %-99 %] 94 % (06/19 0500)  Hemodynamic parameters for last 24 hours:    Intake/Output from previous day: 06/18 0701 - 06/19 0700 In: 720 [P.O.:720] Out: 2800 [Urine:2800]  Intake/Output this shift: No intake/output data recorded.  Vent settings for last 24 hours:    Physical Exam:  Gen: comfortable Neuro: non-focal exam HEENT: PERRL Neck: supple CV: RRR Pulm: unlabored breathing ORA, CTAB,  Abd: soft, NT, less distended, tympanitic MSK: skin abrasions to R hand, left arm, bilateral knees with mild erythema but no significant cellulitis Extr: wwp, no edema  Results for orders placed or performed during the hospital encounter of 12/10/19 (from the past 24 hour(s))  Glucose, capillary     Status: Abnormal   Collection Time: 12/22/19 11:54 AM  Result Value Ref Range   Glucose-Capillary 171 (H) 70 - 99 mg/dL  Glucose, capillary     Status: Abnormal   Collection Time: 12/22/19  5:05 PM  Result Value Ref Range   Glucose-Capillary 188 (H) 70 - 99 mg/dL  Glucose, capillary     Status: Abnormal   Collection Time: 12/22/19  9:20 PM  Result Value Ref Range   Glucose-Capillary 144 (H) 70 - 99 mg/dL    Assessment & Plan: The plan of care was discussed with the bedside nurse for the day, who is in agreement with this plan and no additional concerns were raised.   Present on Admission: **None**    LOS: 13 days   Additional comments:I reviewed the patient's new clinical lab test results.   and I reviewed the  patients new imaging test results.    TBI/falcine SDH/R SAH-   NSGY c/s (Dr. Zada Finders), no repeat head CT indicated, SLP eval, keppra x7d for sz ppx R rib FXs 6-8, L rib FXs 2-8 with L PTX- L chest tube pulled out 6/12 after being retracted out of the pleural space, CXR 6/15 without PTX, stable left pulm contusion Acute hypoxic respiratory failure-  Smokes - he going to try to quit  now on RA, cont pulm toilet  L clavicle fx- ortho c/s (Dr. Alvan Dame), nonop mgmt with sling L scapula fx - ortho c/s (Dr. Alvan Dame), nonop mgmt with sling Multiple abrasions to head, face, and extremities- xeroform, local wound care  T3 and T9 SP fx- pain control T12 compression fx-   NSGY c/s (Dr. Zada Finders), no surgery, no brace R L4 transverse process fracture- pain control Chronic pain (this is for chronic back pain - he is seen at a pain clinic in [facility])-   scheduled APAP, oxycodone 10-15 mg q4 PRN (takes 30 mg q6h at home), gabapentin 600 mg q 8h (home dose), robaxin 1,000 mg q 8h, traMADol 50 mg q 6h, oxycontin30 mg added Hypertension- home lisinopril/HCTZ, PRN metoprolol Leukocytosis -   WBC 19.5 6/17. afebrile, CXR stable 6/15, UA negative/Cx pending, skin abrasions without s/s infection;  Bilateral lower extremity duplex to r/o DVT - negative on 6/18 FEN- regular diet  DVT- SCDs, LMWH Dispo-  await CIR   He asked about going home with [facility] nurse/PT since inpat rehab does not have a bed  Ovidio Kin, MD, [facility] Surgery Office phone:  [phone]

## 2019-12-23 NOTE — Progress Notes (Signed)
HR 109-112 when patient was gotten OOB  to be changed . C/o pain right now but no chest pains or SOB. Will give PRN pain med and continue to watch for now.Incoming shift will continue care.

## 2019-12-23 NOTE — Progress Notes (Signed)
[facility] called to inform that patient is requesting medication for sleep. Patient stated that he has not had any sleep since admission. Ilean Skill LPN
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
## 2019-12-24 LAB — GLUCOSE, CAPILLARY: Glucose-Capillary: 180 mg/dL — ABNORMAL HIGH (ref 70–99)

## 2019-12-24 NOTE — Progress Notes (Signed)
PT Cancellation Note  Patient Details Name: Eric Blake MRN: [id number] DOB: 1965/07/07   Cancelled Treatment:    Reason Eval/Treat Not Completed: Other (comment) Pt politely declining physical therapy session due to increase in L flank pain. He ambulated with mobility tech prior without an assistive device and he has been mobilizing to and from bathroom.     Lillia Pauls, PT, DPT Acute Rehabilitation Services Pager [phone] Office [phone]    Eric Blake 12/24/2019, 3:45 PM

## 2019-12-24 NOTE — Plan of Care (Signed)
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
## 2019-12-25 LAB — CBC WITH DIFFERENTIAL/PLATELET
Abs Immature Granulocytes: 0.11 10*3/uL — ABNORMAL HIGH (ref 0.00–0.07)
Basophils Absolute: 0.1 10*3/uL (ref 0.0–0.1)
Basophils Relative: 1 %
Eosinophils Absolute: 0.7 10*3/uL — ABNORMAL HIGH (ref 0.0–0.5)
Eosinophils Relative: 5 %
HCT: 30.6 % — ABNORMAL LOW (ref 39.0–52.0)
Hemoglobin: 9.8 g/dL — ABNORMAL LOW (ref 13.0–17.0)
Immature Granulocytes: 1 %
Lymphocytes Relative: 16 %
Lymphs Abs: 2 10*3/uL (ref 0.7–4.0)
MCH: 31 pg (ref 26.0–34.0)
MCHC: 32 g/dL (ref 30.0–36.0)
MCV: 96.8 fL (ref 80.0–100.0)
Monocytes Absolute: 0.9 10*3/uL (ref 0.1–1.0)
Monocytes Relative: 7 %
Neutro Abs: 8.8 10*3/uL — ABNORMAL HIGH (ref 1.7–7.7)
Neutrophils Relative %: 70 %
Platelets: 301 10*3/uL (ref 150–400)
RBC: 3.16 MIL/uL — ABNORMAL LOW (ref 4.22–5.81)
RDW: 15.1 % (ref 11.5–15.5)
WBC: 12.5 10*3/uL — ABNORMAL HIGH (ref 4.0–10.5)
nRBC: 0 % (ref 0.0–0.2)

## 2019-12-25 LAB — GLUCOSE, CAPILLARY: Glucose-Capillary: 116 mg/dL — ABNORMAL HIGH (ref 70–99)

## 2019-12-25 MED ORDER — METHOCARBAMOL 500 MG PO TABS: 1000.0000 mg | ORAL_TABLET | Freq: Three times a day (TID) | ORAL | 0 refills | Status: AC | PRN

## 2019-12-25 MED ORDER — ACETAMINOPHEN 325 MG PO TABS: 650.0000 mg | ORAL_TABLET | Freq: Four times a day (QID) | ORAL | Status: AC | PRN

## 2019-12-25 MED ORDER — POTASSIUM CHLORIDE CRYS ER 20 MEQ PO TBCR: 20.0000 meq | EXTENDED_RELEASE_TABLET | Freq: Two times a day (BID) | ORAL | 0 refills | Status: AC

## 2019-12-25 MED ORDER — POLYETHYLENE GLYCOL 3350 17 G PO PACK: 17.0000 g | PACK | Freq: Every day | ORAL | 0 refills | Status: AC | PRN

## 2019-12-25 MED ORDER — DOCUSATE SODIUM 100 MG PO CAPS: 100.0000 mg | ORAL_CAPSULE | Freq: Two times a day (BID) | ORAL | 0 refills | Status: AC

## 2019-12-25 MED ORDER — TRAMADOL HCL 50 MG PO TABS: 50.0000 mg | ORAL_TABLET | Freq: Four times a day (QID) | ORAL | 0 refills | Status: AC | PRN

## 2019-12-25 NOTE — TOC Transition Note (Signed)
Transition of Care [facility]) - CM/SW Discharge Note   Patient Details  Name: Eric Blake MRN: [id number] Date of Birth: 17-May-1966  Transition of Care [facility]) CM/SW Contact:  Glennon Mac, RN Phone Number: 12/25/2019, 12:35 PM   Clinical Narrative:   Pt medically stable for discharge home today with spouse.  He has opted not to go to CIR; attempted to arrange [facility] services, but unable to find agency to accept Medicaid as payor.  Pt and spouse agreeable to OP therapy at [facility]; referral faxed to rehab center at [phone].  Rehab information placed on AVS in Epic.  Referral to [facility] for cane and 3 in 1, to be delivered to bedside prior to dc home.      Final next level of care: OP Rehab Barriers to Discharge: Barriers Resolved   Patient Goals and CMS Choice Patient states their goals for this hospitalization and ongoing recovery are:: Pt was unable to verbalize goals.   Choice offered to / list presented to : Patient                      Discharge Plan and Services   Discharge Planning Services: CM Consult            DME Arranged: 3-N-1, Gilmer Mor   Date DME Agency Contacted: 12/25/19 Time DME Agency Contacted: 1130 Representative spoke with at DME Agency: Oletha Cruel            Social Determinants of Health (SDOH) Interventions     Readmission Risk Interventions Readmission Risk Prevention Plan 12/25/2019  Transportation Screening Complete  PCP or Specialist Appt within 5-7 Days Complete  Home Care Screening Complete  Medication Review (RN CM) Complete   Quintella Baton, RN, BSN  Trauma/Neuro ICU Case Manager [phone]
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
## 2019-12-25 NOTE — Progress Notes (Signed)
Patient discharged to home with instructions and some dressing supplies. Patient refused to wait for his equipments, claims that he got a cane and potty chair at home.

## 2019-12-25 NOTE — Progress Notes (Signed)
Inpatient Rehabilitation-Admissions Coordinator   Unfortunately I still do not have a bed available for this patient today in CIR. Noted pt prefers to DC home at this time. Pt and his wife have no further questions. AC will sign off. TOC team aware of plans.   Cheri Rous, OTR/L  Rehab Admissions Coordinator  [phone] 12/25/2019 11:50 AM

## 2019-12-25 NOTE — Care Management Important Message (Signed)
Important Message  Patient Details  Name: Eric Blake MRN: [id number] Date of Birth: [birth year]   Medicare Important Message Given:  Yes     [name] 12/25/2019, 12:30 PM
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
## 2020-03-08 DIAGNOSIS — R079 Chest pain, unspecified: Secondary | ICD-10-CM

## 2020-03-22 ENCOUNTER — Other Ambulatory Visit: Payer: Self-pay | Admitting: Orthopedic Surgery

## 2020-03-22 DIAGNOSIS — M25512 Pain in left shoulder: Secondary | ICD-10-CM
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
## 2024-06-17 ENCOUNTER — Emergency Department (HOSPITAL_COMMUNITY)

## 2024-06-17 ENCOUNTER — Other Ambulatory Visit: Payer: Self-pay

## 2024-06-17 ENCOUNTER — Emergency Department (HOSPITAL_COMMUNITY)
Admission: EM | Admit: 2024-06-17 | Discharge: 2024-06-17 | Disposition: A | Discharge status: Other Acute Inpatient Hospital | Attending: Emergency Medicine | Admitting: Emergency Medicine

## 2024-06-17 ENCOUNTER — Encounter (HOSPITAL_COMMUNITY): Payer: Self-pay | Admitting: Emergency Medicine

## 2024-06-17 DIAGNOSIS — I4891 Unspecified atrial fibrillation: Secondary | ICD-10-CM | POA: Insufficient documentation

## 2024-06-17 DIAGNOSIS — F17210 Nicotine dependence, cigarettes, uncomplicated: Secondary | ICD-10-CM | POA: Insufficient documentation

## 2024-06-17 DIAGNOSIS — R079 Chest pain, unspecified: Secondary | ICD-10-CM | POA: Insufficient documentation

## 2024-06-17 DIAGNOSIS — I110 Hypertensive heart disease with heart failure: Secondary | ICD-10-CM | POA: Insufficient documentation

## 2024-06-17 DIAGNOSIS — Z7901 Long term (current) use of anticoagulants: Secondary | ICD-10-CM | POA: Insufficient documentation

## 2024-06-17 DIAGNOSIS — R600 Localized edema: Secondary | ICD-10-CM | POA: Insufficient documentation

## 2024-06-17 DIAGNOSIS — I509 Heart failure, unspecified: Secondary | ICD-10-CM | POA: Insufficient documentation

## 2024-06-17 LAB — BASIC METABOLIC PANEL WITH GFR
Anion gap: 13 (ref 5–15)
BUN: 11 mg/dL (ref 6–20)
CO2: 22 mmol/L (ref 22–32)
Calcium: 8.8 mg/dL — ABNORMAL LOW (ref 8.9–10.3)
Chloride: 106 mmol/L (ref 98–111)
Creatinine, Ser: 0.94 mg/dL (ref 0.61–1.24)
GFR, Estimated: >60 mL/min (ref >60)
Glucose, Bld: 108 mg/dL — ABNORMAL HIGH (ref 70–99)
Potassium: 4.1 mmol/L (ref 3.5–5.1)
Sodium: 140 mmol/L (ref 135–145)

## 2024-06-17 LAB — CBC
HCT: 41.8 % (ref 39.0–52.0)
Hemoglobin: 14.2 g/dL (ref 13.0–17.0)
MCH: 31.6 pg (ref 26.0–34.0)
MCHC: 34 g/dL (ref 30.0–36.0)
MCV: 92.9 fL (ref 80.0–100.0)
Platelets: 178 K/uL (ref 150–400)
RBC: 4.5 MIL/uL (ref 4.22–5.81)
RDW: 12.7 % (ref 11.5–15.5)
WBC: 10.4 K/uL (ref 4.0–10.5)
nRBC: 0 % (ref 0.0–0.2)

## 2024-06-17 LAB — TROPONIN T, HIGH SENSITIVITY
Troponin T High Sensitivity: <15 ng/L (ref 0–19)
Troponin T High Sensitivity: <15 ng/L (ref 0–19)

## 2024-06-17 NOTE — Progress Notes (Signed)
 Report given to Nurse Jesus [phone]). Pt discharged with officer and all belongings.

## 2024-06-17 NOTE — ED Triage Notes (Signed)
 Pt arrives from [facility] c/o left sided chest pain that started at about 1730 this afternoon. Also c/o left sided face pain and numbness that started shortly after the chest pain.

## 2024-06-17 NOTE — Discharge Instructions (Signed)
 Similar to the testing and workups that you had in August and October at other hospitals your testing today has been normal showing no signs of heart attack, your x-ray was normal, your blood work was normal, please continue to take your blood thinner, [facility] within the [facility] 3 days for recheck, ER for worsening symptoms

## 2024-06-17 NOTE — ED Provider Notes (Signed)
 [place] [facility] AT [facility] Provider Note   CSN: [id number] Arrival date & time: 06/17/24  8060     Patient presents with: Chest Pain   Eric Blake is a 58 y.o. male.    Chest Pain    This patient is a 58 year old male coming from the [facility] facility with a complaint of chest pain.  He reports a history of atrial fibrillation on Eliquis, he has a history of congestive heart failure, he is also treated for hypertension and he used to smoke cigarettes.  He was in his usual state of health went to jail at 530 laying down on his bed when he felt acute onset of a sharp  left-sided chest discomfort.  There was no shortness of breath, he felt like the pain is going up into his jaw and making his face tingle on the left side.  He has never had anything like this in the past, states that he has not been coughing or febrile, he does have mild swelling of his bilateral lower extremities.  The patient has been seen at an outside emergency department 2 months ago during which time he had his initial troponin which was 24 and it downtrended to 21, he had a negative chest x-ray and an EKG which showed some nonspecific T waves.  Cardiologist reviewed the patient's chart and recommended discharge as he had a recent nuclear stress test which was unremarkable.  When I asked the patient if he has ever had pain in his chest he says no, he clearly had an evaluation 2 months ago  Additionally the patient had been admitted to the hospital in August 2025, he had a ruled out ACS and a stress Myoview with no ischemia in February.  He had a negative workup during that hospitalization.    Prior to Admission medications  Medication Sig Start Date End Date Taking? Authorizing Provider  acetaminophen  (TYLENOL ) 325 MG tablet Take 2 tablets (650 mg total) by mouth every 6 (six) hours as needed. 12/25/19   Augustus Almarie RAMAN, PA-C  albuterol  (VENTOLIN  HFA) 108 (90 Base)  MCG/ACT inhaler Inhale 2 puffs into the lungs every 4 (four) hours as needed. 10/11/19   [provider]  docusate sodium  (COLACE) 100 MG capsule Take 1 capsule (100 mg total) by mouth 2 (two) times daily. 12/25/19   Augustus Almarie RAMAN, PA-C  DULERA  200-5 MCG/ACT AERO Inhale 2 puffs into the lungs 2 (two) times daily. 10/12/19   [provider]  DULoxetine  (CYMBALTA ) 60 MG capsule Take 60 mg by mouth daily. 11/28/19   [provider]  gabapentin  (NEURONTIN ) 600 MG tablet Take 600 mg by mouth 3 (three) times daily. 11/28/19   [provider]  lisinopril -hydrochlorothiazide  (ZESTORETIC ) 20-12.5 MG tablet Take 1 tablet by mouth daily. 10/11/19   [provider]  methocarbamol  (ROBAXIN ) 500 MG tablet Take 2 tablets (1,000 mg total) by mouth every 8 (eight) hours as needed for muscle spasms. 12/25/19   Augustus Almarie RAMAN, PA-C  MOVANTIK  25 MG TABS tablet Take 25 mg by mouth daily. 10/06/19   [provider]  omeprazole (PRILOSEC) 40 MG capsule Take 40 mg by mouth daily. 11/28/19   [provider]  ondansetron  (ZOFRAN -ODT) 4 MG disintegrating tablet Take 4 mg by mouth every 8 (eight) hours as needed. 10/19/19   [provider]  oxycodone  (ROXICODONE ) 30 MG immediate release tablet Take 30 mg by mouth 4 (four) times daily as needed. 11/28/19  [provider]  polyethylene glycol (MIRALAX  / GLYCOLAX ) 17 g packet Take 17 g by mouth daily as needed. 12/25/19   Augustus Almarie RAMAN, PA-C  potassium chloride  SA (KLOR-CON ) 20 MEQ tablet Take 1 tablet (20 mEq total) by mouth 2 (two) times daily. 12/25/19   Augustus Almarie RAMAN, PA-C  rosuvastatin  (CRESTOR ) 40 MG tablet Take 40 mg by mouth daily. 10/11/19   [provider]  sucralfate  (CARAFATE ) 1 g tablet Take 1 g by mouth 4 (four) times daily. 10/11/19   [provider]    Allergies: Promethazine    Review of Systems  Cardiovascular:  Positive for chest pain.  All other systems  reviewed and are negative.   Updated Vital Signs BP (!) 150/72   Pulse 61   Temp (!) 97.5 F (36.4 C) (Oral)   Resp 12   Ht 1.727 m (5' 8)   Wt 115.7 kg   SpO2 96%   BMI 38.77 kg/m   Physical Exam Vitals and nursing note reviewed.  Constitutional:      General: He is not in acute distress.    Appearance: He is well-developed.  HENT:     Head: Normocephalic and atraumatic.     Mouth/Throat:     Pharynx: No oropharyngeal exudate.  Eyes:     General: No scleral icterus.       Right eye: No discharge.        Left eye: No discharge.     Conjunctiva/sclera: Conjunctivae normal.     Pupils: Pupils are equal, round, and reactive to light.  Neck:     Thyroid: No thyromegaly.     Vascular: No JVD.  Cardiovascular:     Rate and Rhythm: Normal rate and regular rhythm.     Heart sounds: Normal heart sounds. No murmur heard.    No friction rub. No gallop.  Pulmonary:     Effort: Pulmonary effort is normal. No respiratory distress.     Breath sounds: Normal breath sounds. No wheezing or rales.  Abdominal:     General: Bowel sounds are normal. There is no distension.     Palpations: Abdomen is soft. There is no mass.     Tenderness: There is no abdominal tenderness.  Musculoskeletal:        General: No tenderness. Normal range of motion.     Cervical back: Normal range of motion and neck supple.     Right lower leg: Edema present.     Left lower leg: Edema present.     Comments: Symmetrical scant bilateral pitting edema  Lymphadenopathy:     Cervical: No cervical adenopathy.  Skin:    General: Skin is warm and dry.     Findings: No erythema or rash.  Neurological:     General: No focal deficit present.     Mental Status: He is alert.     Coordination: Coordination normal.  Psychiatric:        Behavior: Behavior normal.     (all labs ordered are listed, but only abnormal results are displayed) Labs Reviewed  BASIC METABOLIC PANEL WITH GFR - Abnormal; Notable for the  following components:      Result Value   Glucose, Bld 108 (*)    Calcium  8.8 (*)    All other components within normal limits  CBC  TROPONIN T, HIGH SENSITIVITY    EKG: EKG Interpretation Date/Time:  Saturday June 17 2024 19:51:17 EST Ventricular Rate:  58 PR Interval:  150 QRS Duration:  86 QT Interval:  410 QTC Calculation: 402 R Axis:   56  Text Interpretation: Sinus bradycardia Otherwise normal ECG No previous ECGs available Confirmed by Cleotilde Rogue ([id number]) on 06/17/2024 8:07:59 PM  Radiology: DG Chest 2 View Result Date: 06/17/2024 EXAM: 2 VIEW(S) XRAY OF THE CHEST 06/17/2024 08:56:57 PM COMPARISON: XR 12/15/2021 CLINICAL HISTORY: chest pain FINDINGS: LUNGS AND PLEURA: No focal pulmonary opacity. No pleural effusion. No pneumothorax. HEART AND MEDIASTINUM: Aortic arch calcifications. No acute abnormality of the cardiac and mediastinal silhouettes. BONES AND SOFT TISSUES: Old healed bilateral rib fractures. IMPRESSION: 1. No acute findings. Electronically signed by: Greig Pique MD 06/17/2024 09:06 PM EST RP Workstation: [id number]     Procedures   Medications Ordered in the ED - No data to display                                  Medical Decision Making Amount and/or Complexity of Data Reviewed Labs: ordered. Radiology: ordered.    This patient presents to the ED for concern of discomfort in the chest differential diagnosis includes ACS, unlikely to be PE given that his heart rate is 60 oxygen 99% and he is on Eliquis.  This is likely noncardiac chest pain given his multiple recent evaluations for the same.  This could be malingering behavior given that he is incarcerated, it does not seem gastrointestinal and he has no abdominal tenderness    Additional history obtained   Additional history obtained from Electronic Medical Record External records from outside source obtained and reviewed including extensive outside record review including prior  hospitalizations   Lab Tests:  I Ordered, and personally interpreted labs.  The pertinent results include: Normal troponin CBC metabolic panel all unremarkable   Imaging Studies ordered:  I ordered imaging studies including chest x-ray I independently visualized and interpreted imaging which showed normal, no signs of infiltrate pneumothorax or abnormal mediastinum I agree with the radiologist interpretation   Medicines ordered and prescription drug management:  No medications given  Problem List / ED Course:  Patient appears well, appropriately anticoagulated, EKG and lab work unremarkable, compared prior results from prior workups, all unremarkable, likely atypical chest pain, noncardiac in origin   Social Determinants of Health:  Prior smoker, incarcerated        Final diagnoses:  Chest pain, unspecified type    ED Discharge Orders     None          Cleotilde Rogue, MD 06/17/24 2116
